# Patient Record
Sex: Female | Born: 1940
Health system: Southern US, Community
[De-identification: ages and names within clinical notes are randomized; demographics above are authoritative.]

## PROBLEM LIST (undated history)

## (undated) DIAGNOSIS — E7800 Pure hypercholesterolemia, unspecified: Secondary | ICD-10-CM

## (undated) DIAGNOSIS — M1990 Unspecified osteoarthritis, unspecified site: Secondary | ICD-10-CM

## (undated) DIAGNOSIS — E559 Vitamin D deficiency, unspecified: Secondary | ICD-10-CM

## (undated) DIAGNOSIS — I639 Cerebral infarction, unspecified: Secondary | ICD-10-CM

## (undated) DIAGNOSIS — C801 Malignant (primary) neoplasm, unspecified: Secondary | ICD-10-CM

## (undated) DIAGNOSIS — I712 Thoracic aortic aneurysm, without rupture: Secondary | ICD-10-CM

## (undated) DIAGNOSIS — E876 Hypokalemia: Secondary | ICD-10-CM

## (undated) DIAGNOSIS — H3530 Unspecified macular degeneration: Secondary | ICD-10-CM

## (undated) DIAGNOSIS — J449 Chronic obstructive pulmonary disease, unspecified: Secondary | ICD-10-CM

## (undated) DIAGNOSIS — I10 Essential (primary) hypertension: Secondary | ICD-10-CM

## (undated) DIAGNOSIS — H35039 Hypertensive retinopathy, unspecified eye: Secondary | ICD-10-CM

## (undated) HISTORY — DX: Unspecified macular degeneration: H35.30

## (undated) HISTORY — DX: Hypertensive retinopathy, unspecified eye: H35.039

## (undated) HISTORY — PX: ABDOMINAL HYSTERECTOMY: SHX81

## (undated) HISTORY — DX: Malignant (primary) neoplasm, unspecified: C80.1

## (undated) HISTORY — DX: Vitamin D deficiency, unspecified: E55.9

## (undated) HISTORY — PX: CHOLECYSTECTOMY: SHX55

## (undated) HISTORY — DX: Thoracic aortic aneurysm, without rupture: I71.2

---

## 1998-06-05 ENCOUNTER — Other Ambulatory Visit: Admission: RE | Admit: 1998-06-05 | Discharge: 1998-06-05 | Payer: Self-pay | Admitting: Obstetrics & Gynecology

## 1998-10-17 ENCOUNTER — Ambulatory Visit (HOSPITAL_COMMUNITY): Admission: RE | Admit: 1998-10-17 | Discharge: 1998-10-17 | Payer: Self-pay | Admitting: Family Medicine

## 1999-06-10 ENCOUNTER — Encounter: Admission: RE | Admit: 1999-06-10 | Discharge: 1999-06-10 | Payer: Self-pay | Admitting: Obstetrics & Gynecology

## 1999-06-10 ENCOUNTER — Encounter: Payer: Self-pay | Admitting: Obstetrics & Gynecology

## 1999-10-08 ENCOUNTER — Other Ambulatory Visit: Admission: RE | Admit: 1999-10-08 | Discharge: 1999-10-08 | Payer: Self-pay | Admitting: Obstetrics and Gynecology

## 2000-06-14 ENCOUNTER — Encounter: Admission: RE | Admit: 2000-06-14 | Discharge: 2000-06-14 | Payer: Self-pay | Admitting: Obstetrics and Gynecology

## 2000-06-14 ENCOUNTER — Encounter: Payer: Self-pay | Admitting: Obstetrics and Gynecology

## 2000-08-30 ENCOUNTER — Encounter: Payer: Self-pay | Admitting: Family Medicine

## 2000-08-30 ENCOUNTER — Encounter: Admission: RE | Admit: 2000-08-30 | Discharge: 2000-08-30 | Payer: Self-pay | Admitting: Family Medicine

## 2004-05-22 ENCOUNTER — Other Ambulatory Visit: Admission: RE | Admit: 2004-05-22 | Discharge: 2004-05-22 | Payer: Self-pay | Admitting: Family Medicine

## 2004-06-25 ENCOUNTER — Encounter: Admission: RE | Admit: 2004-06-25 | Discharge: 2004-06-25 | Payer: Self-pay | Admitting: Family Medicine

## 2007-11-16 ENCOUNTER — Ambulatory Visit (HOSPITAL_COMMUNITY): Admission: RE | Admit: 2007-11-16 | Discharge: 2007-11-16 | Payer: Self-pay | Admitting: Family Medicine

## 2007-11-17 ENCOUNTER — Encounter (INDEPENDENT_AMBULATORY_CARE_PROVIDER_SITE_OTHER): Payer: Self-pay | Admitting: Family Medicine

## 2007-11-17 ENCOUNTER — Ambulatory Visit: Payer: Self-pay

## 2013-05-09 ENCOUNTER — Emergency Department (HOSPITAL_COMMUNITY)
Admission: EM | Admit: 2013-05-09 | Discharge: 2013-05-09 | Discharge status: Against Medical Advice | Payer: Medicare HMO | Attending: Emergency Medicine | Admitting: Emergency Medicine

## 2013-05-09 ENCOUNTER — Encounter (HOSPITAL_COMMUNITY): Payer: Self-pay | Admitting: Emergency Medicine

## 2013-05-09 ENCOUNTER — Emergency Department (HOSPITAL_COMMUNITY): Payer: Medicare HMO

## 2013-05-09 DIAGNOSIS — R062 Wheezing: Secondary | ICD-10-CM | POA: Insufficient documentation

## 2013-05-09 DIAGNOSIS — E7800 Pure hypercholesterolemia, unspecified: Secondary | ICD-10-CM | POA: Insufficient documentation

## 2013-05-09 DIAGNOSIS — Y9389 Activity, other specified: Secondary | ICD-10-CM | POA: Insufficient documentation

## 2013-05-09 DIAGNOSIS — IMO0001 Reserved for inherently not codable concepts without codable children: Secondary | ICD-10-CM | POA: Insufficient documentation

## 2013-05-09 DIAGNOSIS — J449 Chronic obstructive pulmonary disease, unspecified: Secondary | ICD-10-CM | POA: Insufficient documentation

## 2013-05-09 DIAGNOSIS — F17200 Nicotine dependence, unspecified, uncomplicated: Secondary | ICD-10-CM | POA: Insufficient documentation

## 2013-05-09 DIAGNOSIS — S2231XA Fracture of one rib, right side, initial encounter for closed fracture: Secondary | ICD-10-CM

## 2013-05-09 DIAGNOSIS — S2239XA Fracture of one rib, unspecified side, initial encounter for closed fracture: Secondary | ICD-10-CM | POA: Insufficient documentation

## 2013-05-09 DIAGNOSIS — I10 Essential (primary) hypertension: Secondary | ICD-10-CM | POA: Insufficient documentation

## 2013-05-09 DIAGNOSIS — IMO0002 Reserved for concepts with insufficient information to code with codable children: Secondary | ICD-10-CM | POA: Insufficient documentation

## 2013-05-09 DIAGNOSIS — Y929 Unspecified place or not applicable: Secondary | ICD-10-CM | POA: Insufficient documentation

## 2013-05-09 DIAGNOSIS — J4489 Other specified chronic obstructive pulmonary disease: Secondary | ICD-10-CM | POA: Insufficient documentation

## 2013-05-09 HISTORY — DX: Pure hypercholesterolemia, unspecified: E78.00

## 2013-05-09 HISTORY — DX: Essential (primary) hypertension: I10

## 2013-05-09 HISTORY — DX: Chronic obstructive pulmonary disease, unspecified: J44.9

## 2013-05-09 HISTORY — DX: Hypokalemia: E87.6

## 2013-05-09 MED ORDER — OXYCODONE-ACETAMINOPHEN 5-325 MG PO TABS: 1.0000 | ORAL_TABLET | ORAL | Status: DC | PRN

## 2013-05-09 MED ORDER — METHOCARBAMOL 500 MG PO TABS: 500.0000 mg | ORAL_TABLET | Freq: Two times a day (BID) | ORAL | Status: DC | PRN

## 2013-05-09 NOTE — ED Notes (Signed)
Resp therapist called to inquire about new order.

## 2013-05-09 NOTE — ED Notes (Signed)
Pt states she needs to leave to go to work  

## 2013-05-09 NOTE — ED Notes (Addendum)
Pt completed incentive spirometry, PA informed. Max level reached: 1250

## 2013-05-09 NOTE — ED Notes (Signed)
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  Small bruise noted. C/o right flank/rib pain worse with coughing. Denies SOB. No deformity or crepitus. COPD pt with wheezes auscultated, per pt that is normal for her.

## 2013-05-09 NOTE — ED Notes (Addendum)
Pt unwilling to stay for further evaluation, pt states she has to leave. PA informed. Pt states I am breathing just fine.

## 2013-05-09 NOTE — ED Notes (Signed)
After reviewing all risks of leaving, pt states she will stay for further evaluation. PA updated.

## 2013-05-09 NOTE — ED Notes (Signed)
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 Pt states she did not lose consciousness. Pt states she has pain when she twists at the torso and bends over. Pt states if she does not move she is not in pain.

## 2013-05-09 NOTE — ED Provider Notes (Signed)
CSN: [id number]     Arrival date & time 05/09/13  1925 History  This chart was scribed for non-physician practitioner Dierdre Forth, PA-C working with Derwood Kaplan, MD by Clydene Laming, ED Scribe. This patient was seen in room TR07C/TR07C and the patient's care was started at 9:50 PM.   Chief Complaint  Patient presents with  . Fall    The history is provided by the patient and medical records. No language interpreter was used.   HPI Comments: Chelsey Young is a 72 y.o. 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 Pt states she fell onto the banister on her right rib cage. She reports that she did not fall to the ground, hit her head no loss of consciousness. She denies neck or back pain. She reports taking ibuorofen with mild relief. She states that breathing deeply increases the pain.States she feels swollen in the area of the injury. She denies sob, fever, or chills.  Patient with history of COPD, hypertension and states that she normally wheezes at baseline. She denies frank shortness of breath or anterior chest pain.  She reports she takes Spiriva intermittently for her COPD.  She reports that her major concern today is the pain in her ribs.  Past Medical History  Diagnosis Date  . COPD (chronic obstructive pulmonary disease)   . Hypercholesteremia   . Hypertension   . Hypokalemia    History reviewed. No pertinent past surgical history. History reviewed. No pertinent family history. History  Substance Use Topics  . Smoking status: Current Every Day Smoker    Types: Cigarettes  . Smokeless tobacco: Not on file  . Alcohol Use: No   OB History   Grav Para Term Preterm Abortions TAB SAB Ect Mult Living                 Review of Systems  Constitutional: Negative for fever and chills.  Respiratory: Positive for wheezing. Negative for shortness of breath.   Gastrointestinal: Negative  for nausea and vomiting.  Musculoskeletal: Positive for arthralgias and myalgias. Negative for back pain, joint swelling, neck pain and neck stiffness.       Rib pain  Skin: Negative for wound.  Neurological: Negative for numbness.  Hematological: Does not bruise/bleed easily.  Psychiatric/Behavioral: The patient is not nervous/anxious.   All other systems reviewed and are negative.    Allergies  Review of patient's allergies indicates no known allergies.  Home Medications   Current Outpatient Rx  Name  Route  Sig  Dispense  Refill  . ibuprofen (ADVIL,MOTRIN) 200 MG tablet   Oral   Take 600 mg by mouth every 4 (four) hours as needed for pain.         . methocarbamol (ROBAXIN) 500 MG tablet   Oral   Take 1 tablet (500 mg total) by mouth 2 (two) times daily as needed.   20 tablet   0   . oxyCODONE-acetaminophen (PERCOCET/ROXICET) 5-325 MG per tablet   Oral   Take 1 tablet by mouth every 4 (four) hours as needed for pain.   15 tablet   0    Triage Vials: BP 134/103  Pulse 73  Temp(Src) 98.4 F (36.9 C) (Oral)  Resp 22  Wt 130 lb (58.968 kg)  SpO2 94% Physical Exam  Nursing note and vitals reviewed. Constitutional: She appears well-developed and well-nourished. No distress.  Awake, alert, nontoxic appearance  HENT:  Head:  Normocephalic and atraumatic.  Mouth/Throat: Oropharynx is clear and moist. No oropharyngeal exudate.  Eyes: Conjunctivae are normal. No scleral icterus.  Neck: Normal range of motion. Neck supple.  Cardiovascular: Normal rate, regular rhythm, normal heart sounds and intact distal pulses.   No murmur heard. Pulmonary/Chest: Effort normal. No accessory muscle usage. Not tachypneic. No respiratory distress. She has no decreased breath sounds. She has wheezes (mild, throughout). She has no rhonchi. She has no rales.    Tender palpation of the right lower lateral chest wall with mild ecchymosis over rib #10 No crepitus or flail segment Equal  breath sounds with mild wheezing throughout; no rales, rhonchi or absent breath sounds.  Abdominal: Soft. Bowel sounds are normal. She exhibits no mass. There is no tenderness. There is no rebound and no guarding.  Musculoskeletal: Normal range of motion. She exhibits no edema.  Neurological: She is alert. She exhibits normal muscle tone. Coordination normal.  Speech is clear and goal oriented Moves extremities without ataxia  Skin: Skin is warm and dry. She is not diaphoretic. No erythema.  Psychiatric: She has a normal mood and affect. Her behavior is normal.    ED Course  Procedures (including critical care time) DIAGNOSTIC STUDIES: Oxygen Saturation is 94% on RA, adequate by my interpretation.    COORDINATION OF CARE: 10:01 PM- Discussed treatment plan with pt at bedside. Pt verbalized understanding and agreement with plan.   Labs Review Labs Reviewed - No data to display Imaging Review Dg Ribs Unilateral W/chest Right  05/09/2013   CLINICAL DATA:  Fall with right rib pain.  EXAM: RIGHT RIBS AND CHEST - 3+ VIEW  COMPARISON:  None.  FINDINGS: Chest radiograph demonstrates clear lungs. Heart and mediastinum are within normal limits. Negative for a pneumothorax. There appears to be a mildly displaced fracture of the lateral right 10th rib.  IMPRESSION: Mildly displaced fracture of the right 10th rib. Negative for pneumothorax.   Electronically Signed   By: Richarda Overlie M.D.   On: 05/09/2013 20:29    EKG Interpretation   None       MDM   1. Rib fracture, right, closed, initial encounter      Chelsey Young presents with rib pain after minor fall into a banister yesterday.   Chest x-ray with mildly displaced fracture of the right 10th rib.  Patient alert and oriented, nontoxic, nonseptic appearing. No accessory muscle use or tachypnea. Patient without hypoxia here. No flail segment or crepitus. Patient denies shortness of breath but does endorse increased pain with deep  breathing. Patient unwilling to consider admission for pain control reporting that she would like to go home.  Patient very mobile and not febrile. No pneumothorax on x-ray and no major displacement of the rib. Will DC home with pain control and muscle relaxer. Recommend close followup primary care physician late this week for reevaluation.  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 Strict return percussions and signs and symptoms of pneumonia discussed.  It has been determined that no acute conditions requiring further emergency intervention are present at this time. The patient/guardian have been advised of the diagnosis and plan. We have discussed signs and symptoms that warrant return to the ED, such as changes or worsening in symptoms.   Vital signs are stable at discharge.   BP 162/79  Pulse 65  Temp(Src) 98.4 F (36.9 C) (Oral)  Resp 22  Wt 130 lb (58.968 kg)  SpO2 97%  Patient/guardian has voiced understanding  and agreed to follow-up with the PCP or specialist.   Patient was discussed with Dr. Derwood Kaplan who agrees with the plan to discharge.   Dahlia Client , PA-C 05/09/13 2309

## 2013-05-09 NOTE — ED Notes (Signed)
Pt states she wants to leave AMA 

## 2013-05-09 NOTE — ED Notes (Signed)
Informed it was okay to discharge the pt, pt has been cleared for discharge.

## 2013-05-10 NOTE — ED Provider Notes (Signed)
Medical screening examination/treatment/procedure(s) were performed by non-physician practitioner and as supervising physician I was immediately available for consultation/collaboration.   Derwood Kaplan, MD 05/10/13 [phone]

## 2013-06-25 ENCOUNTER — Encounter (HOSPITAL_COMMUNITY): Payer: Self-pay | Admitting: Emergency Medicine

## 2013-06-25 ENCOUNTER — Emergency Department (HOSPITAL_COMMUNITY): Payer: Medicare HMO

## 2013-06-25 ENCOUNTER — Emergency Department (HOSPITAL_COMMUNITY)
Admission: EM | Admit: 2013-06-25 | Discharge: 2013-06-25 | Disposition: A | Discharge status: Home / Self Care | Payer: Medicare HMO | Attending: Emergency Medicine | Admitting: Emergency Medicine

## 2013-06-25 DIAGNOSIS — Y9389 Activity, other specified: Secondary | ICD-10-CM | POA: Insufficient documentation

## 2013-06-25 DIAGNOSIS — Z7982 Long term (current) use of aspirin: Secondary | ICD-10-CM | POA: Insufficient documentation

## 2013-06-25 DIAGNOSIS — I10 Essential (primary) hypertension: Secondary | ICD-10-CM | POA: Insufficient documentation

## 2013-06-25 DIAGNOSIS — J449 Chronic obstructive pulmonary disease, unspecified: Secondary | ICD-10-CM | POA: Insufficient documentation

## 2013-06-25 DIAGNOSIS — F17200 Nicotine dependence, unspecified, uncomplicated: Secondary | ICD-10-CM | POA: Insufficient documentation

## 2013-06-25 DIAGNOSIS — X500XXA Overexertion from strenuous movement or load, initial encounter: Secondary | ICD-10-CM | POA: Insufficient documentation

## 2013-06-25 DIAGNOSIS — E7800 Pure hypercholesterolemia, unspecified: Secondary | ICD-10-CM | POA: Insufficient documentation

## 2013-06-25 DIAGNOSIS — Y929 Unspecified place or not applicable: Secondary | ICD-10-CM | POA: Insufficient documentation

## 2013-06-25 DIAGNOSIS — Z79899 Other long term (current) drug therapy: Secondary | ICD-10-CM | POA: Insufficient documentation

## 2013-06-25 DIAGNOSIS — J4489 Other specified chronic obstructive pulmonary disease: Secondary | ICD-10-CM | POA: Insufficient documentation

## 2013-06-25 DIAGNOSIS — S63509A Unspecified sprain of unspecified wrist, initial encounter: Secondary | ICD-10-CM | POA: Insufficient documentation

## 2013-06-25 MED ORDER — HYDROCODONE-ACETAMINOPHEN 5-325 MG PO TABS: 1.0000 | ORAL_TABLET | ORAL | Status: DC | PRN

## 2013-06-25 NOTE — ED Notes (Signed)
Pt was helping son move an amplifier and she felt a pop in her L wrist/forearm and shes had pain there since. Cms intact

## 2013-06-25 NOTE — ED Provider Notes (Signed)
Patient injured her left forearm all time to lift something heavy. On exam she has tenderness in the mid forearm, the compartments are soft, her wrist and elbow are very supple, normal pulses at the left wrist, normal sensation and grip. X-ray reveals no signs of fracture. Patient will be stable for discharge with Rice therapy and sling.  Medical screening examination/treatment/procedure(s) were conducted as a shared visit with non-physician practitioner(s) and myself.  I personally evaluated the patient during the encounter.        Vida Roller, MD 06/27/13 [phone]

## 2013-06-25 NOTE — ED Provider Notes (Signed)
CSN: [id number]     Arrival date & time 06/25/13  1733 History  This chart was scribed for Marlon Pel, PA-C, working with Vida Roller, MD by Blanchard Kelch, ED Scribe. This patient was seen in room TR06C/TR06C and the patient's care was started at 6:18 PM.      Chief Complaint  Patient presents with  . Arm Pain    Patient is a 72 y.o. female presenting with arm pain. The history is provided by the patient. No language interpreter was used.  Arm Pain    HPI Comments: Chelsey Young is a 72 y.o. female who presents to the Emergency Department complaining of a left wrist injury that occurred when she was helping to lift a 200 lb amplifier two hours ago. She states that she heard a pop and had pain to her left forearm and wrist immediately after that occurred. She has associated swelling to the forearm. The pain is still present but mildly improved from the onset. The pain is worsened with movement, but she denies decreased range of motion. She states that she has fractured her left elbow in the past. She denies any other injury.   Past Medical History  Diagnosis Date  . COPD (chronic obstructive pulmonary disease)   . Hypercholesteremia   . Hypertension   . Hypokalemia    History reviewed. No pertinent past surgical history. History reviewed. No pertinent family history. History  Substance Use Topics  . Smoking status: Current Every Day Smoker    Types: Cigarettes  . Smokeless tobacco: Not on file  . Alcohol Use: No   OB History   Grav Para Term Preterm Abortions TAB SAB Ect Mult Living                 Review of Systems  Musculoskeletal: Positive for arthralgias and joint swelling.  All other systems reviewed and are negative.    Allergies  Review of patient's allergies indicates no known allergies.  Home Medications   Current Outpatient Rx  Name  Route  Sig  Dispense  Refill  . aspirin 81 MG tablet   Oral   Take 81 mg by mouth daily.         .  potassium chloride SA (K-DUR,KLOR-CON) 20 MEQ tablet   Oral   Take 20 mEq by mouth daily.         Marland Kitchen HYDROcodone-acetaminophen (NORCO/VICODIN) 5-325 MG per tablet   Oral   Take 1 tablet by mouth every 4 (four) hours as needed.   10 tablet   0    Triage Vitals: BP 157/69  Pulse 78  Temp(Src) 98.1 F (36.7 C) (Oral)  Resp 22  Wt 125 lb (56.7 kg)  SpO2 95%  Physical Exam  Nursing note and vitals reviewed. Constitutional: She appears well-developed and well-nourished. No distress.  HENT:  Head: Normocephalic and atraumatic.  Eyes: Pupils are equal, round, and reactive to light.  Neck: Normal range of motion. Neck supple.  Cardiovascular: Normal rate and regular rhythm.   Pulmonary/Chest: Effort normal.  Abdominal: Soft.  Musculoskeletal:       Left wrist: She exhibits tenderness and bony tenderness. She exhibits normal range of motion, no swelling, no effusion, no crepitus, no deformity and no laceration.  Neurological: She is alert.  Skin: Skin is warm and dry.    ED Course  Procedures (including critical care time)  DIAGNOSTIC STUDIES: Oxygen Saturation is 95% on room air, adequate by my interpretation.    COORDINATION OF  CARE: 6:20 PM -Will order left forearm x-ray. Patient verbalizes understanding and agrees with treatment plan.  7:49 PM- Patient has wrist sprain. Discussed results of x-ray with patient, which did not show any fractures. Dr. Hyacinth Meeker saw patient and recommends ice, rest and pain medication. Patient verbalizes understanding and agrees with treatment plan.  Labs Review Labs Reviewed - No data to display Imaging Review Dg Forearm Left  06/25/2013   CLINICAL DATA:  Forearm pain after injury.  EXAM: LEFT FOREARM - 2 VIEW  COMPARISON:  None.  FINDINGS: Two view exam of the left forearm shows no evidence for an acute fracture. The patient is status post resection of the radial head. There is degenerative change at the elbow. No worrisome lytic or sclerotic  osseous abnormality.  IMPRESSION: No acute bony findings.   Electronically Signed   By: Kennith Center M.D.   On: 06/25/2013 19:34    EKG Interpretation   None       MDM   1. Wrist sprain and strain, left, initial encounter    72 y.o.Chelsey Young's evaluation in the Emergency Department is complete. It has been determined that no acute conditions requiring further emergency intervention are present at this time. The patient/guardian have been advised of the diagnosis and plan. We have discussed signs and symptoms that warrant return to the ED, such as changes or worsening in symptoms.  Vital signs are stable at discharge. Filed Vitals:   06/25/13 1933  BP: 151/73  Pulse: 63  Temp:   Resp: 18    Patient/guardian has voiced understanding and agreed to follow-up with the PCP or specialist.  I personally performed the services described in this documentation, which was scribed in my presence. The recorded information has been reviewed and is accurate.   Dorthula Matas, PA-C 06/25/13 1954

## 2013-06-27 NOTE — ED Provider Notes (Signed)
Patient injured her left forearm all time to lift something heavy. On exam she has tenderness in the mid forearm, the compartments are soft, her wrist and elbow are very supple, normal pulses at the left wrist, normal sensation and grip. X-ray reveals no signs of fracture. Patient will be stable for discharge with Rice therapy and sling.  Medical screening examination/treatment/procedure(s) were conducted as a shared visit with non-physician practitioner(s) and myself.  I personally evaluated the patient during the encounter.         D , MD 06/27/13 0054 

## 2013-07-01 ENCOUNTER — Emergency Department (HOSPITAL_COMMUNITY)
Admission: EM | Admit: 2013-07-01 | Discharge: 2013-07-01 | Disposition: A | Discharge status: Home / Self Care | Payer: Medicare HMO | Attending: Emergency Medicine | Admitting: Emergency Medicine

## 2013-07-01 ENCOUNTER — Emergency Department (HOSPITAL_COMMUNITY): Payer: Medicare HMO

## 2013-07-01 ENCOUNTER — Encounter (HOSPITAL_COMMUNITY): Payer: Self-pay | Admitting: Emergency Medicine

## 2013-07-01 DIAGNOSIS — S0990XA Unspecified injury of head, initial encounter: Secondary | ICD-10-CM

## 2013-07-01 DIAGNOSIS — I10 Essential (primary) hypertension: Secondary | ICD-10-CM | POA: Insufficient documentation

## 2013-07-01 DIAGNOSIS — Z7982 Long term (current) use of aspirin: Secondary | ICD-10-CM | POA: Insufficient documentation

## 2013-07-01 DIAGNOSIS — Y929 Unspecified place or not applicable: Secondary | ICD-10-CM | POA: Insufficient documentation

## 2013-07-01 DIAGNOSIS — S0101XA Laceration without foreign body of scalp, initial encounter: Secondary | ICD-10-CM

## 2013-07-01 DIAGNOSIS — W1809XA Striking against other object with subsequent fall, initial encounter: Secondary | ICD-10-CM | POA: Insufficient documentation

## 2013-07-01 DIAGNOSIS — Z8639 Personal history of other endocrine, nutritional and metabolic disease: Secondary | ICD-10-CM | POA: Insufficient documentation

## 2013-07-01 DIAGNOSIS — F17200 Nicotine dependence, unspecified, uncomplicated: Secondary | ICD-10-CM | POA: Insufficient documentation

## 2013-07-01 DIAGNOSIS — Y93E1 Activity, personal bathing and showering: Secondary | ICD-10-CM | POA: Insufficient documentation

## 2013-07-01 DIAGNOSIS — J4489 Other specified chronic obstructive pulmonary disease: Secondary | ICD-10-CM | POA: Insufficient documentation

## 2013-07-01 DIAGNOSIS — Z79899 Other long term (current) drug therapy: Secondary | ICD-10-CM | POA: Insufficient documentation

## 2013-07-01 DIAGNOSIS — Z862 Personal history of diseases of the blood and blood-forming organs and certain disorders involving the immune mechanism: Secondary | ICD-10-CM | POA: Insufficient documentation

## 2013-07-01 DIAGNOSIS — J449 Chronic obstructive pulmonary disease, unspecified: Secondary | ICD-10-CM | POA: Insufficient documentation

## 2013-07-01 DIAGNOSIS — S0100XA Unspecified open wound of scalp, initial encounter: Secondary | ICD-10-CM | POA: Insufficient documentation

## 2013-07-01 DIAGNOSIS — Z8673 Personal history of transient ischemic attack (TIA), and cerebral infarction without residual deficits: Secondary | ICD-10-CM | POA: Insufficient documentation

## 2013-07-01 HISTORY — DX: Cerebral infarction, unspecified: I63.9

## 2013-07-01 MED ORDER — LIDOCAINE-EPINEPHRINE-TETRACAINE (LET) SOLUTION
3.0000 mL | Freq: Once | NASAL | Status: DC
  Filled 2013-07-01: qty 3

## 2013-07-01 NOTE — ED Provider Notes (Signed)
CSN: [id number]     Arrival date & time 07/01/13  2041 History  This chart was scribed for non-physician practitioner, Renne Crigler, PA-C working with Flint Melter, MD, by Andrew Au, ED Scribe. This patient was seen in room TR09C/TR09C and the patient's care was started at     Chief Complaint  Patient presents with  . Fall  . Head Injury   The history is provided by the patient. No language interpreter was used.   HPI Comments: Chelsey Young is a 72 y.o. female who presents to the Emergency Department complaining of head injury onset immediately prior to arrival. She states while coming out of the bathtub she missed the top step which caused her to slip, fall and hit the back of her head on the sink.cabinet.  She sustained laceration to occiput. After the fall she states that she felt her head and noticed  blood after the fall. She denies LOC, blurred vision, emesis, numbness weakness, tingling of the extremities. She also struck her L forehead during the fall.  She was brought to the ED by neighbor. She takes ASA but no other anticoagulants. She reports that her tetanus is UTD.  Past Medical History  Diagnosis Date  . COPD (chronic obstructive pulmonary disease)   . Hypercholesteremia   . Hypertension   . Hypokalemia   . Stroke    Past Surgical History  Procedure Laterality Date  . Abdominal hysterectomy     History reviewed. No pertinent family history. History  Substance Use Topics  . Smoking status: Current Some Day Smoker    Types: Cigarettes  . Smokeless tobacco: Not on file  . Alcohol Use: No   OB History   Grav Para Term Preterm Abortions TAB SAB Ect Mult Living                 Review of Systems  Constitutional: Negative for fatigue.  HENT: Negative for tinnitus.   Eyes: Negative for photophobia, pain and visual disturbance.  Respiratory: Negative for shortness of breath.   Cardiovascular: Negative for chest pain.  Gastrointestinal: Negative for nausea  and vomiting.  Musculoskeletal: Negative for back pain, gait problem and neck pain.  Skin: Positive for wound.  Neurological: Negative for dizziness, syncope, weakness, light-headedness, numbness and headaches.  Psychiatric/Behavioral: Negative for confusion and decreased concentration.    Allergies  Review of patient's allergies indicates no known allergies.  Home Medications   Current Outpatient Rx  Name  Route  Sig  Dispense  Refill  . aspirin 81 MG tablet   Oral   Take 81 mg by mouth daily.         Marland Kitchen ibuprofen (ADVIL,MOTRIN) 200 MG tablet   Oral   Take 200 mg by mouth every 6 (six) hours as needed for headache.         . potassium chloride SA (K-DUR,KLOR-CON) 20 MEQ tablet   Oral   Take 20 mEq by mouth daily.          Triage Vitals BP 137/57  Pulse 82  Temp(Src) 97.4 F (36.3 C) (Oral)  Resp 18  Ht 5\' 2"  (1.575 m)  Wt 130 lb 3.2 oz (59.058 kg)  BMI 23.81 kg/m2  SpO2 98% Physical Exam  Nursing note and vitals reviewed. Constitutional: She is oriented to person, place, and time. She appears well-developed and well-nourished. No distress.  HENT:  Head: Normocephalic and atraumatic. Head is without raccoon's eyes and without Battle's sign.  Right Ear: Tympanic membrane, external  ear and ear canal normal. No hemotympanum.  Left Ear: Tympanic membrane, external ear and ear canal normal. No hemotympanum.  Nose: Nose normal. No nasal septal hematoma.  Mouth/Throat: Uvula is midline, oropharynx is clear and moist and mucous membranes are normal.  4 cm linear clean hemostatic laceration to the occipital scalp. Minor L frontal hematoma.   Eyes: Conjunctivae, EOM and lids are normal. Pupils are equal, round, and reactive to light. Right eye exhibits no nystagmus. Left eye exhibits no nystagmus.  No visible hyphema noted  Neck: Normal range of motion. Neck supple. No tracheal deviation present.  Cardiovascular: Normal rate and regular rhythm.   Pulmonary/Chest: Effort  normal and breath sounds normal. No respiratory distress.  Abdominal: Soft. There is no tenderness.  Musculoskeletal: Normal range of motion.       Cervical back: She exhibits normal range of motion, no tenderness and no bony tenderness.       Thoracic back: She exhibits no tenderness and no bony tenderness.       Lumbar back: She exhibits no tenderness and no bony tenderness.  Neurological: She is alert and oriented to person, place, and time. She has normal strength and normal reflexes. No cranial nerve deficit or sensory deficit. Coordination normal. GCS eye subscore is 4. GCS verbal subscore is 5. GCS motor subscore is 6.  Skin: Skin is warm and dry.  Psychiatric: She has a normal mood and affect. Her behavior is normal.    ED Course  Procedures  DIAGNOSTIC STUDIES: Oxygen Saturation is 98% on RA, normal by my interpretation.    COORDINATION OF CARE: 9:44 PM- Pt advised of plan for treatment and pt agrees.  LACERATION REPAIR PROCEDURE NOTE The patient's identification was confirmed and consent was obtained. This procedure was performed by Renne Crigler, PA-C at 9:44 PM. Site: occipital scalp  Sterile procedures observe Anesthetic used: 2% lidocaine with epi, 5 cc Length: 4 cm # of Staples: 5 Tetanus UTD Site anesthetized, irrigated with dermal cleanser and skin scrub, explored without evidence of foreign body, wound well approximated, site covered with dry, sterile dressing.  Patient tolerated procedure well without complications. Instructions for care discussed verbally and patient provided with additional written instructions for homecare and f/u.  CT reviewed by myself. Spoke with Dr. Benard Rink who noted L frontal hematoma and patient confirmed to me that she did hit the area.   Patient was counseled on head injury precautions and symptoms that should indicate their return to the ED.  These include severe worsening headache, vision changes, confusion, loss of consciousness,  trouble walking, nausea & vomiting, or weakness/tingling in extremities.    Patient counseled on wound care. Patient counseled on need to return or see PCP/urgent care for suture removal in 5-7 days. Patient was urged to return to the Emergency Department urgently with worsening pain, swelling, expanding erythema especially if it streaks away from the affected area, fever, or if they have any other concerns. Patient verbalized understanding.    Medications  lidocaine-EPINEPHrine-tetracaine (LET) solution (3 mLs Topical Pending 07/01/13 2158)   Labs Review Labs Reviewed - No data to display Imaging Review Ct Head Wo Contrast  07/01/2013   CLINICAL DATA:  Larey Seat out of tub.  Hit head.  Pain.  EXAM: CT HEAD WITHOUT CONTRAST  TECHNIQUE: Contiguous axial images were obtained from the base of the skull through the vertex without contrast.  COMPARISON:  MR brain 11/16/2007.  FINDINGS: No evidence for acute infarction, hemorrhage, mass lesion, hydrocephalus, or extra-axial fluid.  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  Moderate size left frontal scalp hematoma for soft tissue swelling. No underlying skull fracture. There is a history of hitting the back of head on the sink with slight right paramedian soft tissue swelling but no underlying occipital bone fracture. No underlying subdural hematoma. No contrecoup injury. Vascular calcification. Clear sinuses and mastoids.  IMPRESSION: Left frontal scalp hematoma? Without skull fracture or intracranial hemorrhage. Right paramedian occipital scalp laceration fairly subtle but no occipital bone fracture. No evidence for subdural collection. Chronic changes as described.   Electronically Signed   By: Davonna Belling M.D.   On: 07/01/2013 22:44    EKG Interpretation   None       MDM   1. Scalp laceration, initial encounter   2. Head injury, initial encounter    Head injury, CT neg for intracranial injury.    Laceration -- repaired without complication.   I personally performed the services described in this documentation, which was scribed in my presence. The recorded information has been reviewed and is accurate.    Renne Crigler, PA-C 07/01/13 2336

## 2013-07-01 NOTE — ED Notes (Signed)
Pt st's she was getting out of bathtub and fell hitting her head on the counter. Pt has laceration to top of head.  Bleeding controlled.  Pt denies LOC.  Pt alert and oriented x's 3.

## 2013-07-01 NOTE — ED Notes (Signed)
Patient stated she fell out of the bathtub.  States she has a garden tub and missed the top step when coming out.

## 2013-07-01 NOTE — ED Notes (Signed)
Discharge and follow up instructions reviewed. Pt verbalized understanding.  

## 2013-07-02 NOTE — ED Provider Notes (Signed)
Medical screening examination/treatment/procedure(s) were performed by non-physician practitioner and as supervising physician I was immediately available for consultation/collaboration.   L , MD 07/02/13 0103 

## 2014-07-25 DIAGNOSIS — N289 Disorder of kidney and ureter, unspecified: Secondary | ICD-10-CM | POA: Diagnosis not present

## 2014-08-03 DIAGNOSIS — J441 Chronic obstructive pulmonary disease with (acute) exacerbation: Secondary | ICD-10-CM | POA: Diagnosis not present

## 2014-11-06 DIAGNOSIS — Z8541 Personal history of malignant neoplasm of cervix uteri: Secondary | ICD-10-CM | POA: Diagnosis not present

## 2014-11-06 DIAGNOSIS — Z7982 Long term (current) use of aspirin: Secondary | ICD-10-CM | POA: Diagnosis not present

## 2014-11-06 DIAGNOSIS — S51812A Laceration without foreign body of left forearm, initial encounter: Secondary | ICD-10-CM | POA: Diagnosis not present

## 2014-11-06 DIAGNOSIS — E780 Pure hypercholesterolemia: Secondary | ICD-10-CM | POA: Diagnosis not present

## 2014-11-06 DIAGNOSIS — Z8673 Personal history of transient ischemic attack (TIA), and cerebral infarction without residual deficits: Secondary | ICD-10-CM | POA: Diagnosis not present

## 2014-11-06 DIAGNOSIS — J449 Chronic obstructive pulmonary disease, unspecified: Secondary | ICD-10-CM | POA: Diagnosis not present

## 2014-11-06 DIAGNOSIS — I10 Essential (primary) hypertension: Secondary | ICD-10-CM | POA: Diagnosis not present

## 2014-11-06 DIAGNOSIS — Z0000 Encounter for general adult medical examination without abnormal findings: Secondary | ICD-10-CM | POA: Diagnosis not present

## 2014-11-21 DIAGNOSIS — Z1212 Encounter for screening for malignant neoplasm of rectum: Secondary | ICD-10-CM | POA: Diagnosis not present

## 2014-12-06 DIAGNOSIS — F17200 Nicotine dependence, unspecified, uncomplicated: Secondary | ICD-10-CM | POA: Diagnosis not present

## 2014-12-06 DIAGNOSIS — I10 Essential (primary) hypertension: Secondary | ICD-10-CM | POA: Diagnosis not present

## 2015-01-16 DIAGNOSIS — H18413 Arcus senilis, bilateral: Secondary | ICD-10-CM | POA: Diagnosis not present

## 2015-01-16 DIAGNOSIS — H524 Presbyopia: Secondary | ICD-10-CM | POA: Diagnosis not present

## 2015-01-16 DIAGNOSIS — H3532 Exudative age-related macular degeneration: Secondary | ICD-10-CM | POA: Diagnosis not present

## 2015-01-16 DIAGNOSIS — Z9849 Cataract extraction status, unspecified eye: Secondary | ICD-10-CM | POA: Diagnosis not present

## 2015-01-16 DIAGNOSIS — H52223 Regular astigmatism, bilateral: Secondary | ICD-10-CM | POA: Diagnosis not present

## 2015-01-16 DIAGNOSIS — H2513 Age-related nuclear cataract, bilateral: Secondary | ICD-10-CM | POA: Diagnosis not present

## 2015-01-16 DIAGNOSIS — H5203 Hypermetropia, bilateral: Secondary | ICD-10-CM | POA: Diagnosis not present

## 2015-01-16 DIAGNOSIS — H11153 Pinguecula, bilateral: Secondary | ICD-10-CM | POA: Diagnosis not present

## 2015-02-05 DIAGNOSIS — J441 Chronic obstructive pulmonary disease with (acute) exacerbation: Secondary | ICD-10-CM | POA: Diagnosis not present

## 2015-02-06 ENCOUNTER — Encounter (INDEPENDENT_AMBULATORY_CARE_PROVIDER_SITE_OTHER): Payer: Self-pay | Admitting: Ophthalmology

## 2015-03-01 ENCOUNTER — Encounter (INDEPENDENT_AMBULATORY_CARE_PROVIDER_SITE_OTHER): Payer: Commercial Managed Care - HMO | Admitting: Ophthalmology

## 2015-03-15 ENCOUNTER — Encounter (INDEPENDENT_AMBULATORY_CARE_PROVIDER_SITE_OTHER): Payer: Self-pay | Admitting: Ophthalmology

## 2015-04-08 ENCOUNTER — Encounter (INDEPENDENT_AMBULATORY_CARE_PROVIDER_SITE_OTHER): Payer: Commercial Managed Care - HMO | Admitting: Ophthalmology

## 2015-05-11 ENCOUNTER — Encounter (HOSPITAL_COMMUNITY): Payer: Self-pay

## 2015-05-11 ENCOUNTER — Emergency Department (HOSPITAL_COMMUNITY)
Admission: EM | Admit: 2015-05-11 | Discharge: 2015-05-11 | Disposition: A | Discharge status: Home / Self Care | Payer: Commercial Managed Care - HMO | Attending: Emergency Medicine | Admitting: Emergency Medicine

## 2015-05-11 DIAGNOSIS — W2209XA Striking against other stationary object, initial encounter: Secondary | ICD-10-CM | POA: Diagnosis not present

## 2015-05-11 DIAGNOSIS — S0101XA Laceration without foreign body of scalp, initial encounter: Secondary | ICD-10-CM | POA: Insufficient documentation

## 2015-05-11 DIAGNOSIS — S0990XA Unspecified injury of head, initial encounter: Secondary | ICD-10-CM | POA: Diagnosis present

## 2015-05-11 DIAGNOSIS — R51 Headache: Secondary | ICD-10-CM | POA: Diagnosis not present

## 2015-05-11 DIAGNOSIS — Y999 Unspecified external cause status: Secondary | ICD-10-CM | POA: Diagnosis not present

## 2015-05-11 DIAGNOSIS — Z720 Tobacco use: Secondary | ICD-10-CM | POA: Insufficient documentation

## 2015-05-11 DIAGNOSIS — Y92410 Unspecified street and highway as the place of occurrence of the external cause: Secondary | ICD-10-CM | POA: Diagnosis not present

## 2015-05-11 DIAGNOSIS — Y9389 Activity, other specified: Secondary | ICD-10-CM | POA: Diagnosis not present

## 2015-05-11 DIAGNOSIS — S0191XA Laceration without foreign body of unspecified part of head, initial encounter: Secondary | ICD-10-CM

## 2015-05-11 DIAGNOSIS — Z7982 Long term (current) use of aspirin: Secondary | ICD-10-CM | POA: Diagnosis not present

## 2015-05-11 DIAGNOSIS — J449 Chronic obstructive pulmonary disease, unspecified: Secondary | ICD-10-CM | POA: Insufficient documentation

## 2015-05-11 DIAGNOSIS — Z8673 Personal history of transient ischemic attack (TIA), and cerebral infarction without residual deficits: Secondary | ICD-10-CM | POA: Diagnosis not present

## 2015-05-11 DIAGNOSIS — Z79899 Other long term (current) drug therapy: Secondary | ICD-10-CM | POA: Diagnosis not present

## 2015-05-11 DIAGNOSIS — I10 Essential (primary) hypertension: Secondary | ICD-10-CM | POA: Diagnosis not present

## 2015-05-11 DIAGNOSIS — Z8639 Personal history of other endocrine, nutritional and metabolic disease: Secondary | ICD-10-CM | POA: Diagnosis not present

## 2015-05-11 MED ORDER — LIDOCAINE-EPINEPHRINE-TETRACAINE (LET) SOLUTION
3.0000 mL | Freq: Once | NASAL | Status: AC
  Administered 2015-05-11: 3 mL via TOPICAL
  Filled 2015-05-11: qty 3

## 2015-05-11 NOTE — ED Notes (Addendum)
Pt states the trunk of her hatchback/car fell down and hit the top back part of her head today. No LOC, no fall. There is about a 1 inch vertical laceration to back of head, scant bleeding but controlled. Pt denies being on blood thinners.

## 2015-05-11 NOTE — ED Notes (Signed)
Pt A&OX4, ambulatory at d/c with steady gait, NAD 
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
## 2015-05-11 NOTE — Discharge Instructions (Signed)
Head Injury, Adult You have a head injury. Headaches and throwing up (vomiting) are common after a head injury. It should be easy to wake up from sleeping. Sometimes you must stay in the hospital. Most problems happen within the first 24 hours. Side effects may occur up to 7-10 days after the injury.  WHAT ARE THE TYPES OF HEAD INJURIES? Head injuries can be as minor as a bump. Some head injuries can be more severe. More severe head injuries include:  A jarring injury to the brain (concussion).  A bruise of the brain (contusion). This mean there is bleeding in the brain that can cause swelling.  A cracked skull (skull fracture).  Bleeding in the brain that collects, clots, and forms a bump (hematoma). WHEN SHOULD I GET HELP RIGHT AWAY?   You are confused or sleepy.  You cannot be woken up.  You feel sick to your stomach (nauseous) or keep throwing up (vomiting).  Your dizziness or unsteadiness is getting worse.  You have very bad, lasting headaches that are not helped by medicine. Take medicines only as told by your doctor.  You cannot use your arms or legs like normal.  You cannot walk.  You notice changes in the black spots in the center of the colored part of your eye (pupil).  You have clear or bloody fluid coming from your nose or ears.  You have trouble seeing. During the next 24 hours after the injury, you must stay with someone who can watch you. This person should get help right away (call 911 in the [place] if you start to shake and are not able to control it (have seizures), you pass out, or you are unable to wake up. HOW CAN I PREVENT A HEAD INJURY IN THE FUTURE?  Wear seat belts.  Wear a helmet while bike riding and playing sports like football.  Stay away from dangerous activities around the house. WHEN CAN I RETURN TO NORMAL ACTIVITIES AND ATHLETICS? See your doctor before doing these activities. You should not do normal activities or play contact sports until 1  week after the following symptoms have stopped:  Headache that does not go away.  Dizziness.  Poor attention.  Confusion.  Memory problems.  Sickness to your stomach or throwing up.  Tiredness.  Fussiness.  Bothered by bright lights or loud noises.  Anxiousness or depression.  Restless sleep. MAKE SURE YOU:   Understand these instructions.  Will watch your condition.  Will get help right away if you are not doing well or get worse.   This information is not intended to replace advice given to you by your health care provider. Make sure you discuss any questions you have with your health care provider.   Document Released: 06/18/2008 Document Revised: 07/27/2014 Document Reviewed: 03/13/2013 Elsevier Interactive Patient Education 2016 [place], [place], or Adhesive Wound Closure Health care providers use stitches (sutures), staples, and certain glue (skin adhesives) to hold skin together while it heals (wound closure). You may need this treatment after you have surgery or if you cut your skin accidentally. These methods help your skin to heal more quickly and make it less likely that you will have a scar. A wound may take several months to heal completely. The type of wound you have determines when your wound gets closed. In most cases, the wound is closed as soon as possible (primary skin closure). Sometimes, closure is delayed so the wound can be cleaned and allowed to heal  naturally. This reduces the chance of infection. Delayed closure may be needed if your wound:  Is caused by a bite.  Happened more than 6 hours ago.  Involves loss of skin or the tissues under the skin.  Has dirt or debris in it that cannot be removed.  Is infected. WHAT ARE THE DIFFERENT KINDS OF WOUND CLOSURES? There are many options for wound closure. The one that your health care provider uses depends on how deep and how large your wound is. Adhesive Glue To use this type of  glue to close a wound, your health care provider holds the edges of the wound together and paints the glue on the surface of your skin. You may need more than one layer of glue. Then the wound may be covered with a light bandage (dressing). This type of skin closure may be used for small wounds that are not deep (superficial). Using glue for wound closure is less painful than other methods. It does not require a medicine that numbs the area (local anesthetic). This method also leaves nothing to be removed. Adhesive glue is often used for children and on facial wounds. Adhesive glue cannot be used for wounds that are deep, uneven, or bleeding. It is not used inside of a wound.  Adhesive Strips These strips are made of sticky (adhesive), porous paper. They are applied across your skin edges like a regular adhesive bandage. You leave them on until they fall off. Adhesive strips may be used to close very superficial wounds. They may also be used along with sutures to improve the closure of your skin edges.  Sutures Sutures are the oldest method of wound closure. Sutures can be made from natural substances, such as silk, or from synthetic materials, such as nylon and steel. They can be made from a material that your body can break down as your wound heals (absorbable), or they can be made from a material that needs to be removed from your skin (nonabsorbable). They come in many different strengths and sizes. Your health care provider attaches the sutures to a steel needle on one end. Sutures can be passed through your skin, or through the tissues beneath your skin. Then they are tied and cut. Your skin edges may be closed in one continuous stitch or in separate stitches. Sutures are strong and can be used for all kinds of wounds. Absorbable sutures may be used to close tissues under the skin. The disadvantage of sutures is that they may cause skin reactions that lead to infection. Nonabsorbable sutures need to  be removed. Staples When surgical staples are used to close a wound, the edges of your skin on both sides of the wound are brought close together. A staple is placed across the wound, and an instrument secures the edges together. Staples are often used to close surgical cuts (incisions). Staples are faster to use than sutures, and they cause less skin reaction. Staples need to be removed using a tool that bends the staples away from your skin. HOW DO I CARE FOR MY WOUND CLOSURE?  Take medicines only as directed by your health care provider.  If you were prescribed an antibiotic medicine for your wound, finish it all even if you start to feel better.  Use ointments or creams only as directed by your health care provider.  Wash your hands with soap and water before and after touching your wound.  Do not soak your wound in water. Do not take baths, swim, or  use a hot tub until your health care provider approves.  Ask your health care provider when you can start showering. Cover your wound if directed by your health care provider.  Do not take out your own sutures or staples.  Do not pick at your wound. Picking can cause an infection.  Keep all follow-up visits as directed by your health care provider. This is important. HOW LONG WILL I HAVE MY WOUND CLOSURE?  Leave adhesive glue on your skin until the glue peels away.  Leave adhesive strips on your skin until the strips fall off.  Absorbable sutures will dissolve within several days.  Nonabsorbable sutures and staples must be removed. The location of the wound will determine how long they stay in. This can range from several days to a couple of weeks. WHEN SHOULD I SEEK HELP FOR MY WOUND CLOSURE? Contact your health care provider if:  You have a fever.  You have chills.  You have drainage, redness, swelling, or pain at your wound.  There is a bad smell coming from your wound.  The skin edges of your wound start to separate  after your sutures have been removed.  Your wound becomes thick, raised, and darker in color after your sutures come out (scarring).   This information is not intended to replace advice given to you by your health care provider. Make sure you discuss any questions you have with your health care provider.   Document Released: 03/31/2001 Document Revised: 07/27/2014 Document Reviewed: 12/13/2013 Elsevier Interactive Patient Education [employer].

## 2015-05-18 ENCOUNTER — Encounter (HOSPITAL_COMMUNITY): Payer: Self-pay | Admitting: Family Medicine

## 2015-05-18 ENCOUNTER — Emergency Department (HOSPITAL_COMMUNITY)
Admission: EM | Admit: 2015-05-18 | Discharge: 2015-05-18 | Disposition: A | Discharge status: Home / Self Care | Payer: Commercial Managed Care - HMO | Attending: Emergency Medicine | Admitting: Emergency Medicine

## 2015-05-18 DIAGNOSIS — Z4802 Encounter for removal of sutures: Secondary | ICD-10-CM | POA: Diagnosis not present

## 2015-05-18 DIAGNOSIS — E876 Hypokalemia: Secondary | ICD-10-CM | POA: Insufficient documentation

## 2015-05-18 DIAGNOSIS — J449 Chronic obstructive pulmonary disease, unspecified: Secondary | ICD-10-CM | POA: Insufficient documentation

## 2015-05-18 DIAGNOSIS — Z7982 Long term (current) use of aspirin: Secondary | ICD-10-CM | POA: Diagnosis not present

## 2015-05-18 DIAGNOSIS — Z8673 Personal history of transient ischemic attack (TIA), and cerebral infarction without residual deficits: Secondary | ICD-10-CM | POA: Insufficient documentation

## 2015-05-18 DIAGNOSIS — F17210 Nicotine dependence, cigarettes, uncomplicated: Secondary | ICD-10-CM | POA: Insufficient documentation

## 2015-05-18 DIAGNOSIS — I10 Essential (primary) hypertension: Secondary | ICD-10-CM | POA: Diagnosis not present

## 2015-05-18 NOTE — Discharge Instructions (Signed)
Wound Closure Removal °The staples, stitches, or skin adhesives that were used to close your skin have been removed. You will need to continue the care described here until the wound is completely healed and your health care provider confirms that wound care can be stopped. °HOW DO I CARE FOR MY WOUND? °How you care for your wound after the wound closure has been removed depends on the kind of wound closure you had. °Stitches or Staples °· Keep the wound site dry and clean. Do not soak it in water. °· If skin adhesive strips were applied after the staples were removed, they will begin to peel off in a few days. Allow them to remain in place until they fall off on their own. °· If you still have a bandage (dressing), change it at least once a day or as directed by your health care provider. If the dressing sticks, pour warm, sterile water over it until it loosens and can be removed without pulling apart the wound edges. Pat the area dry with a soft, clean towel. Do not rub the wound because that may cause bleeding. °· Apply cream or ointment that stops the growth of bacteria (antibacterial cream or antibacterial ointment) only if your health care provider has directed you to do so. °· Place a nonstick bandage over the wound to prevent the dressing from sticking. °· Cover the nonstick bandage with a new dressing as directed by your health care provider. °· If the bandage becomes wet or dirty or it develops a bad smell, change it as soon as possible. °· Take medicines only as directed by your health care provider. °Adhesive Strips or Glue °· Adhesive strips and glue peel off on their own. °· Leave adhesive strips and glue in place until they fall off. °ARE THERE ANY BATHING RESTRICTIONS ONCE MY WOUND CLOSURE IS REMOVED? °Do not take baths, swim, or use a hot tub until your health care provider approves. °HOW CAN I DECREASE THE SIZE OF MY SCAR? °How your scar heals and the size of your scar depend on many factors, such  as your age, the type of scar you have, and genetic factors. The following may help decrease the size of your scar: °· Sunscreen. Use sunscreen with a sun protection factor (SPF) of at least 15 when out in the sun. Reapply the sunscreen every two hours. °· Friction massage. Once your wound is completely healed, you can gently massage the scarred area. This can decrease scar thickness. °WHEN SHOULD I SEEK HELP?  °Seek help if: °· You have a fever. °· You have chills. °· You have drainage, redness, swelling, or pain at your wound. °· There is a bad smell coming from your wound. °· Your wound edges open up or do not stay closed after the wound closure has been removed. °  °This information is not intended to replace advice given to you by your health care provider. Make sure you discuss any questions you have with your health care provider. °  °Document Released: 06/18/2008 Document Revised: 07/27/2014 Document Reviewed: 11/21/2013 °Elsevier Interactive Patient Education ©2016 Elsevier Inc[employer] ° °
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
## 2015-05-18 NOTE — ED Notes (Signed)
Declined W/C at D/C and was escorted to lobby by RN. 

## 2015-05-18 NOTE — ED Notes (Signed)
Pt here for for staple removal on her head.

## 2015-09-24 DIAGNOSIS — J40 Bronchitis, not specified as acute or chronic: Secondary | ICD-10-CM | POA: Diagnosis not present

## 2015-09-24 DIAGNOSIS — J441 Chronic obstructive pulmonary disease with (acute) exacerbation: Secondary | ICD-10-CM | POA: Diagnosis not present

## 2015-09-25 ENCOUNTER — Other Ambulatory Visit: Payer: Self-pay | Admitting: Internal Medicine

## 2015-09-25 DIAGNOSIS — R059 Cough, unspecified: Secondary | ICD-10-CM

## 2015-09-25 DIAGNOSIS — R05 Cough: Secondary | ICD-10-CM

## 2015-09-26 ENCOUNTER — Other Ambulatory Visit: Payer: Medicare HMO

## 2015-10-03 ENCOUNTER — Ambulatory Visit
Admission: RE | Admit: 2015-10-03 | Discharge: 2015-10-03 | Disposition: A | Discharge status: Home / Self Care | Payer: Commercial Managed Care - HMO | Source: Ambulatory Visit | Attending: Internal Medicine | Admitting: Internal Medicine

## 2015-10-03 DIAGNOSIS — R059 Cough, unspecified: Secondary | ICD-10-CM

## 2015-10-03 DIAGNOSIS — R918 Other nonspecific abnormal finding of lung field: Secondary | ICD-10-CM | POA: Diagnosis not present

## 2015-10-03 DIAGNOSIS — R05 Cough: Secondary | ICD-10-CM

## 2015-10-03 MED ORDER — IOPAMIDOL (ISOVUE-300) INJECTION 61%
75.0000 mL | Freq: Once | INTRAVENOUS | Status: AC | PRN
  Administered 2015-10-03: 75 mL via INTRAVENOUS

## 2015-11-11 DIAGNOSIS — Z7982 Long term (current) use of aspirin: Secondary | ICD-10-CM | POA: Diagnosis not present

## 2015-11-11 DIAGNOSIS — I10 Essential (primary) hypertension: Secondary | ICD-10-CM | POA: Diagnosis not present

## 2015-11-11 DIAGNOSIS — E7800 Pure hypercholesterolemia, unspecified: Secondary | ICD-10-CM | POA: Diagnosis not present

## 2015-11-14 DIAGNOSIS — Z0000 Encounter for general adult medical examination without abnormal findings: Secondary | ICD-10-CM | POA: Diagnosis not present

## 2015-11-14 DIAGNOSIS — Z23 Encounter for immunization: Secondary | ICD-10-CM | POA: Diagnosis not present

## 2015-11-14 DIAGNOSIS — E7800 Pure hypercholesterolemia, unspecified: Secondary | ICD-10-CM | POA: Diagnosis not present

## 2015-11-14 DIAGNOSIS — E876 Hypokalemia: Secondary | ICD-10-CM | POA: Diagnosis not present

## 2015-11-21 DIAGNOSIS — E876 Hypokalemia: Secondary | ICD-10-CM | POA: Diagnosis not present

## 2015-12-02 DIAGNOSIS — H6012 Cellulitis of left external ear: Secondary | ICD-10-CM | POA: Diagnosis not present

## 2015-12-26 DIAGNOSIS — M542 Cervicalgia: Secondary | ICD-10-CM | POA: Diagnosis not present

## 2016-01-06 DIAGNOSIS — M542 Cervicalgia: Secondary | ICD-10-CM | POA: Diagnosis not present

## 2016-01-07 DIAGNOSIS — M542 Cervicalgia: Secondary | ICD-10-CM | POA: Diagnosis not present

## 2016-01-13 DIAGNOSIS — M542 Cervicalgia: Secondary | ICD-10-CM | POA: Diagnosis not present

## 2016-01-15 DIAGNOSIS — M542 Cervicalgia: Secondary | ICD-10-CM | POA: Diagnosis not present

## 2016-01-20 DIAGNOSIS — M542 Cervicalgia: Secondary | ICD-10-CM | POA: Diagnosis not present

## 2016-05-18 DIAGNOSIS — I10 Essential (primary) hypertension: Secondary | ICD-10-CM | POA: Diagnosis not present

## 2016-05-18 DIAGNOSIS — J449 Chronic obstructive pulmonary disease, unspecified: Secondary | ICD-10-CM | POA: Diagnosis not present

## 2016-05-18 DIAGNOSIS — E7800 Pure hypercholesterolemia, unspecified: Secondary | ICD-10-CM | POA: Diagnosis not present

## 2016-08-04 DIAGNOSIS — Z720 Tobacco use: Secondary | ICD-10-CM | POA: Diagnosis not present

## 2016-08-04 DIAGNOSIS — J329 Chronic sinusitis, unspecified: Secondary | ICD-10-CM | POA: Diagnosis not present

## 2016-08-04 DIAGNOSIS — J45909 Unspecified asthma, uncomplicated: Secondary | ICD-10-CM | POA: Diagnosis not present

## 2016-10-10 DIAGNOSIS — J449 Chronic obstructive pulmonary disease, unspecified: Secondary | ICD-10-CM | POA: Diagnosis not present

## 2016-10-15 DIAGNOSIS — R911 Solitary pulmonary nodule: Secondary | ICD-10-CM | POA: Diagnosis not present

## 2016-10-15 DIAGNOSIS — R0602 Shortness of breath: Secondary | ICD-10-CM | POA: Diagnosis not present

## 2016-10-15 DIAGNOSIS — J441 Chronic obstructive pulmonary disease with (acute) exacerbation: Secondary | ICD-10-CM | POA: Diagnosis not present

## 2016-10-20 DIAGNOSIS — R918 Other nonspecific abnormal finding of lung field: Secondary | ICD-10-CM | POA: Diagnosis not present

## 2016-10-20 DIAGNOSIS — R0602 Shortness of breath: Secondary | ICD-10-CM | POA: Diagnosis not present

## 2016-10-20 DIAGNOSIS — B370 Candidal stomatitis: Secondary | ICD-10-CM | POA: Diagnosis not present

## 2016-10-20 DIAGNOSIS — F17200 Nicotine dependence, unspecified, uncomplicated: Secondary | ICD-10-CM | POA: Diagnosis not present

## 2016-10-20 DIAGNOSIS — J449 Chronic obstructive pulmonary disease, unspecified: Secondary | ICD-10-CM | POA: Diagnosis not present

## 2016-10-20 DIAGNOSIS — Z87891 Personal history of nicotine dependence: Secondary | ICD-10-CM | POA: Diagnosis not present

## 2016-10-27 ENCOUNTER — Other Ambulatory Visit: Payer: Self-pay | Admitting: Internal Medicine

## 2016-10-27 DIAGNOSIS — R911 Solitary pulmonary nodule: Secondary | ICD-10-CM

## 2016-10-28 DIAGNOSIS — R918 Other nonspecific abnormal finding of lung field: Secondary | ICD-10-CM | POA: Diagnosis not present

## 2016-10-28 DIAGNOSIS — F17200 Nicotine dependence, unspecified, uncomplicated: Secondary | ICD-10-CM | POA: Diagnosis not present

## 2016-10-28 DIAGNOSIS — J449 Chronic obstructive pulmonary disease, unspecified: Secondary | ICD-10-CM | POA: Diagnosis not present

## 2016-10-28 DIAGNOSIS — Z87891 Personal history of nicotine dependence: Secondary | ICD-10-CM | POA: Diagnosis not present

## 2016-10-29 ENCOUNTER — Ambulatory Visit
Admission: RE | Admit: 2016-10-29 | Discharge: 2016-10-29 | Disposition: A | Discharge status: Home / Self Care | Payer: Commercial Managed Care - HMO | Source: Ambulatory Visit | Attending: Internal Medicine | Admitting: Internal Medicine

## 2016-10-29 DIAGNOSIS — I712 Thoracic aortic aneurysm, without rupture, unspecified: Secondary | ICD-10-CM

## 2016-10-29 DIAGNOSIS — R911 Solitary pulmonary nodule: Secondary | ICD-10-CM

## 2016-10-29 HISTORY — DX: Thoracic aortic aneurysm, without rupture, unspecified: I71.20

## 2016-10-29 HISTORY — DX: Thoracic aortic aneurysm, without rupture: I71.2

## 2016-11-11 DIAGNOSIS — E559 Vitamin D deficiency, unspecified: Secondary | ICD-10-CM | POA: Diagnosis not present

## 2016-11-11 DIAGNOSIS — Z0000 Encounter for general adult medical examination without abnormal findings: Secondary | ICD-10-CM | POA: Diagnosis not present

## 2016-11-11 DIAGNOSIS — M810 Age-related osteoporosis without current pathological fracture: Secondary | ICD-10-CM | POA: Diagnosis not present

## 2016-11-11 DIAGNOSIS — I10 Essential (primary) hypertension: Secondary | ICD-10-CM | POA: Diagnosis not present

## 2016-11-17 DIAGNOSIS — I10 Essential (primary) hypertension: Secondary | ICD-10-CM | POA: Diagnosis not present

## 2016-11-17 DIAGNOSIS — Z0000 Encounter for general adult medical examination without abnormal findings: Secondary | ICD-10-CM | POA: Diagnosis not present

## 2016-11-17 DIAGNOSIS — J449 Chronic obstructive pulmonary disease, unspecified: Secondary | ICD-10-CM | POA: Diagnosis not present

## 2016-11-17 DIAGNOSIS — E7800 Pure hypercholesterolemia, unspecified: Secondary | ICD-10-CM | POA: Diagnosis not present

## 2016-12-02 DIAGNOSIS — R0602 Shortness of breath: Secondary | ICD-10-CM | POA: Diagnosis not present

## 2016-12-02 DIAGNOSIS — J449 Chronic obstructive pulmonary disease, unspecified: Secondary | ICD-10-CM | POA: Diagnosis not present

## 2016-12-02 DIAGNOSIS — R918 Other nonspecific abnormal finding of lung field: Secondary | ICD-10-CM | POA: Diagnosis not present

## 2016-12-02 DIAGNOSIS — Z87891 Personal history of nicotine dependence: Secondary | ICD-10-CM | POA: Diagnosis not present

## 2016-12-02 DIAGNOSIS — E7800 Pure hypercholesterolemia, unspecified: Secondary | ICD-10-CM | POA: Diagnosis not present

## 2016-12-02 DIAGNOSIS — F17200 Nicotine dependence, unspecified, uncomplicated: Secondary | ICD-10-CM | POA: Diagnosis not present

## 2016-12-30 ENCOUNTER — Institutional Professional Consult (permissible substitution) (INDEPENDENT_AMBULATORY_CARE_PROVIDER_SITE_OTHER): Payer: Medicare HMO | Admitting: Surgery

## 2016-12-30 ENCOUNTER — Encounter: Payer: Self-pay | Admitting: Surgery

## 2016-12-30 ENCOUNTER — Encounter: Payer: Self-pay | Admitting: *Deleted

## 2016-12-30 VITALS — BP 127/73 | HR 73 | Resp 18 | Ht 62.0 in | Wt 125.0 lb

## 2016-12-30 DIAGNOSIS — I712 Thoracic aortic aneurysm, without rupture, unspecified: Secondary | ICD-10-CM

## 2016-12-30 DIAGNOSIS — C801 Malignant (primary) neoplasm, unspecified: Secondary | ICD-10-CM

## 2016-12-30 DIAGNOSIS — E559 Vitamin D deficiency, unspecified: Secondary | ICD-10-CM | POA: Insufficient documentation

## 2016-12-30 NOTE — Progress Notes (Signed)
Cardiothoracic Surgery Consultation  PCP is Deland Pretty, MD Referring Provider is Deland Pretty, MD  Chief Complaint  Patient presents with  . TAA    new eval with CT CHEST 10/29/16    HPI:  The patient is a 76 year old woman with a history of smoking and COPD, hypertension, hypercholesterolemia, TIA and cervical cancer in the past who is followed by Dr. Shelia Media. She has been seen by Dr. Verdie Mosher at [facility] for her COPD. She had a CT scan of the chest on 10/03/2015 after an abnormal CXR. This showed an 8 mm indeterminate patchy density at the left apex and a 4 mm left upper lobe lung nodule. She recently had a follow up CT of the chest on 10/29/2016 which showed a fusiform ascending aortic aneurysm measuring 4.0 cm as well as multiple bilateral lung nodules, most of which were stable. There was a single 2 mm nodule in the RLL that appeared more solid compared to a year ago when it was sub-solid. She continues to smoke about 3 cigarettes per day. She had smoked 1 ppd for many years and then quit for two years but restarted after her husband died. She still works in a green house taking care of plants.  Past Medical History:  Diagnosis Date  . Cancer (HCC)    CERVICAL  . COPD (chronic obstructive pulmonary disease) ([place])   . Hypercholesteremia   . Hypertension   . Hypokalemia   . Stroke [facility])    TIA  . Thoracic aortic aneurysm without rupture ([place]) 10/29/2016   PER CT CHEST  . Vitamin D deficiency     Past Surgical History:  Procedure Laterality Date  . ABDOMINAL HYSTERECTOMY     WITH BSO    Family History  Problem Relation Age of Onset  . Myasthenia gravis Daughter   . Epilepsy Son     Social History Social History  Substance Use Topics  . Smoking status: Current Every Day Smoker    Packs/day: 1.00    Years: 60.00    Types: Cigarettes  . Smokeless tobacco: Never Used     Comment: 2-3 CIGARETTES  A  DAY  . Alcohol use No    Current Outpatient  Prescriptions  Medication Sig Dispense Refill  . albuterol (PROVENTIL) (2.5 MG/3ML) 0.083% nebulizer solution Take 2.5 mg by nebulization every 6 (six) hours as needed for wheezing or shortness of breath.    Marland Kitchen aspirin 81 MG tablet Take 81 mg by mouth daily.    . Cholecalciferol (VITAMIN D3) 5000 units CAPS Take 1 capsule by mouth daily.    . fluticasone furoate-vilanterol (BREO ELLIPTA) 100-25 MCG/INH AEPB Inhale 1 puff into the lungs daily.    . simvastatin (ZOCOR) 40 MG tablet Take 40 mg by mouth at bedtime as needed.    . triamterene-hydrochlorothiazide (DYAZIDE) 37.5-25 MG capsule Take 1 capsule by mouth daily.    Marland Kitchen umeclidinium bromide (INCRUSE ELLIPTA) 62.5 MCG/INH AEPB Inhale 1 puff into the lungs daily.     No current facility-administered medications for this visit.     No Known Allergies  Review of Systems  Constitutional: Negative for activity change, appetite change, fatigue, fever and unexpected weight change.  HENT:       Dentures  Eyes: Negative.   Respiratory: Positive for cough. Negative for shortness of breath.   Cardiovascular: Negative for chest pain, palpitations and leg swelling.  Gastrointestinal: Negative.   Endocrine: Negative.   Genitourinary: Negative.   Musculoskeletal:  Positive for myalgias.  Skin: Negative.   Neurological:       Hx of TIA  Hematological: Bruises/bleeds easily.  Psychiatric/Behavioral: Negative.     BP 127/73 (BP Location: Right Arm, Patient Position: Sitting, Cuff Size: Normal)   Pulse 73   Resp 18   Ht 5\' 2"  (1.575 m)   Wt 125 lb (56.7 kg)   SpO2 95% Comment: ON RA  BMI 22.86 kg/m  Physical Exam  Constitutional: She is oriented to person, place, and time. She appears well-developed and well-nourished. No distress.  HENT:  Head: Normocephalic and atraumatic.  Mouth/Throat: Oropharynx is clear and moist.  Eyes: EOM are normal. Pupils are equal, round, and reactive to light.  Neck: Neck supple. No JVD present. No thyromegaly  present.  Cardiovascular: Normal rate, regular rhythm, normal heart sounds and intact distal pulses.   No murmur heard. Pulmonary/Chest: Effort normal. No respiratory distress. She has wheezes.  Abdominal: Soft. Bowel sounds are normal. She exhibits no distension and no mass. There is no tenderness.  Musculoskeletal: Normal range of motion. She exhibits no edema.  Lymphadenopathy:    She has no cervical adenopathy.  Neurological: She is alert and oriented to person, place, and time.  Skin: Skin is warm and dry.  Psychiatric: She has a normal mood and affect.     Diagnostic Tests:   CT CHEST WO CONTRAST (Accession [id number]) (Order [id number])  Imaging  Date: 10/29/2016 Department: [facility] [place] Released By: Shelda Pal Authorizing: Deland Pretty, MD  Exam Information   Status Exam Begun  Exam Ended   Final [99] 10/29/2016 9:20 AM 10/29/2016 9:30 AM  PACS Images   Show images for CT CHEST WO CONTRAST  Study Result   CLINICAL DATA:  Lung nodule, followup, history COPD, cough, occasional shortness of breath with exertion, prior stroke, hypertension, smoker, history of cancer of the cervix in 1972  EXAM: CT CHEST WITHOUT CONTRAST  TECHNIQUE: Multidetector CT imaging of the chest was performed following the standard protocol without IV contrast. Sagittal and coronal MPR images reconstructed from axial data set.  COMPARISON:  10/03/2015  FINDINGS: Cardiovascular: Atherosclerotic calcifications aorta, coronary arteries and proximal great vessels. Aneurysmal dilatation ascending thoracic aorta 4.0 cm transverse image 54. Minimal pericardial fluid or thickening.  Mediastinum/Nodes: Visualized base of cervical region unremarkable. Esophagus normal appearance for technique. Scattered normal sized mediastinal lymph nodes without thoracic adenopathy.  Lungs/Pleura: 3 mm LEFT upper lobe subpleural nodule image 26 stable. Linear  subsegmental atelectasis anteromedial RIGHT upper lobe new. 4 mm posterior subpleural nodule RIGHT upper lobe stable in length, slightly more prominent in conspicuity. Tiny RIGHT lower lobe nodule 2 mm diameter image 62 stable. 2 mm RIGHT lower lobe nodule image 57 previously appeared ground glass. Scarring at medial LEFT upper lobe stable. No additional nodule, infiltrate, pleural effusion or pneumothorax.  Upper Abdomen: Post cholecystectomy. Remaining visualized upper abdomen unremarkable  Musculoskeletal: Diffusely demineralized.  IMPRESSION: Aortic atherosclerosis and coronary arterial calcification with aneurysmal dilatation of the ascending thoracic aorta 4.0 cm transverse, recommendation below.  Recommend annual imaging followup by CTA or MRA. This recommendation follows 2010 ACCF/AHA/AATS/ACR/ASA/SCA/SCAI/SIR/STS/SVM Guidelines for the Diagnosis and Management of Patients with Thoracic Aortic Disease. Circulation. 2010; 121: H741-U384  Multiple BILATERAL pulmonary nodules, majority stable, single nodule in RIGHT lower lobe 2 mm diameter appearing solid whereas previously this appeared sub solid, though this could potentially be related to differences in volume averaging.  Followup CT imaging recommended in 12 months to assess stability of these  findings.  This recommendation follows the consensus statement: Guidelines for Management of Small Pulmonary Nodules Detected on CT Scans: A Statement from the [place] as published in Radiology 2005; 237:395-400.   Electronically Signed   By: Lavonia Dana M.D.   On: 10/29/2016 10:08    Impression:  1. She has a 4.0 cm fusiform ascending aortic aneurysm that I measured at 3.8 cm on CT in 09/2015. This may be slightly larger over the past year but I suspect it is probably stable in size. I stressed the importance of good BP control and smoking cessation. This will require follow up in one year.  2. She has  multiple small bilateral pulmonary nodules that for the most part are stable with the exception of a 2 mm nodule in the RLL that may be more solid. This will require follow up in one year. I stressed the importance of smoking cessation.  I have reviewed the CT images with her and answered all of her questions.    Plan:  I will see her back in one year with a CTA of the chest to reevaluate the aorta and the lung nodules.   I spent 45 minutes performing this consultation and > 50% of this time was spent face to face counseling and coordinating the care of this patient's ascending aortic aneurysm and lung nodules.  Gaye Pollack, MD [facility] [phone]

## 2017-05-13 DIAGNOSIS — E7800 Pure hypercholesterolemia, unspecified: Secondary | ICD-10-CM | POA: Diagnosis not present

## 2017-05-13 DIAGNOSIS — E559 Vitamin D deficiency, unspecified: Secondary | ICD-10-CM | POA: Diagnosis not present

## 2017-05-20 DIAGNOSIS — J449 Chronic obstructive pulmonary disease, unspecified: Secondary | ICD-10-CM | POA: Diagnosis not present

## 2017-05-20 DIAGNOSIS — I10 Essential (primary) hypertension: Secondary | ICD-10-CM | POA: Diagnosis not present

## 2017-05-20 DIAGNOSIS — E559 Vitamin D deficiency, unspecified: Secondary | ICD-10-CM | POA: Diagnosis not present

## 2017-05-20 DIAGNOSIS — E7800 Pure hypercholesterolemia, unspecified: Secondary | ICD-10-CM | POA: Diagnosis not present

## 2017-05-20 DIAGNOSIS — E876 Hypokalemia: Secondary | ICD-10-CM | POA: Diagnosis not present

## 2017-06-14 DIAGNOSIS — E876 Hypokalemia: Secondary | ICD-10-CM | POA: Diagnosis not present

## 2017-10-18 DIAGNOSIS — J019 Acute sinusitis, unspecified: Secondary | ICD-10-CM | POA: Diagnosis not present

## 2017-11-22 DIAGNOSIS — E559 Vitamin D deficiency, unspecified: Secondary | ICD-10-CM | POA: Diagnosis not present

## 2017-11-22 DIAGNOSIS — E7800 Pure hypercholesterolemia, unspecified: Secondary | ICD-10-CM | POA: Diagnosis not present

## 2017-11-22 DIAGNOSIS — I10 Essential (primary) hypertension: Secondary | ICD-10-CM | POA: Diagnosis not present

## 2017-11-25 DIAGNOSIS — I10 Essential (primary) hypertension: Secondary | ICD-10-CM | POA: Diagnosis not present

## 2017-11-25 DIAGNOSIS — E7800 Pure hypercholesterolemia, unspecified: Secondary | ICD-10-CM | POA: Diagnosis not present

## 2017-11-25 DIAGNOSIS — E559 Vitamin D deficiency, unspecified: Secondary | ICD-10-CM | POA: Diagnosis not present

## 2017-11-25 DIAGNOSIS — J449 Chronic obstructive pulmonary disease, unspecified: Secondary | ICD-10-CM | POA: Diagnosis not present

## 2017-11-25 DIAGNOSIS — Z0000 Encounter for general adult medical examination without abnormal findings: Secondary | ICD-10-CM | POA: Diagnosis not present

## 2017-12-09 DIAGNOSIS — J449 Chronic obstructive pulmonary disease, unspecified: Secondary | ICD-10-CM | POA: Diagnosis not present

## 2017-12-10 ENCOUNTER — Other Ambulatory Visit: Payer: Self-pay | Admitting: Surgery

## 2017-12-10 DIAGNOSIS — I712 Thoracic aortic aneurysm, without rupture, unspecified: Secondary | ICD-10-CM

## 2018-01-05 ENCOUNTER — Encounter: Payer: Self-pay | Admitting: Surgery

## 2018-01-05 ENCOUNTER — Ambulatory Visit
Admission: RE | Admit: 2018-01-05 | Discharge: 2018-01-05 | Disposition: A | Discharge status: Home / Self Care | Payer: Medicare HMO | Source: Ambulatory Visit | Attending: Surgery | Admitting: Surgery

## 2018-01-05 ENCOUNTER — Other Ambulatory Visit: Payer: Self-pay

## 2018-01-05 ENCOUNTER — Ambulatory Visit (INDEPENDENT_AMBULATORY_CARE_PROVIDER_SITE_OTHER): Payer: Medicare HMO | Admitting: Surgery

## 2018-01-05 VITALS — BP 130/65 | HR 82 | Resp 16 | Ht 62.0 in | Wt 124.0 lb

## 2018-01-05 DIAGNOSIS — R918 Other nonspecific abnormal finding of lung field: Secondary | ICD-10-CM | POA: Diagnosis not present

## 2018-01-05 DIAGNOSIS — I712 Thoracic aortic aneurysm, without rupture, unspecified: Secondary | ICD-10-CM

## 2018-01-05 MED ORDER — IOPAMIDOL (ISOVUE-370) INJECTION 76%
75.0000 mL | Freq: Once | INTRAVENOUS | Status: AC | PRN
  Administered 2018-01-05: 75 mL via INTRAVENOUS

## 2018-01-05 NOTE — Progress Notes (Signed)
HPI:  This 77 year old woman with a history of smoking and COPD, hypertension, hypercholesterolemia returns for follow-up of a 4.0 cm fusiform ascending aortic aneurysm as well as multiple small bilateral lung nodules.  She continues to smoke about 1 pack of cigarettes over a 4-day period.  She said that she had the flu several weeks ago and is still getting over that.  She has some chronic shortness of breath and wheezing.  She denies any chest pain.  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  Current Outpatient Medications  Medication Sig Dispense Refill  . albuterol (PROVENTIL) (2.5 MG/3ML) 0.083% nebulizer solution Take 2.5 mg by nebulization every 6 (six) hours as needed for wheezing or shortness of breath.    Marland Kitchen aspirin 81 MG tablet Take 81 mg by mouth daily.    . Cholecalciferol (VITAMIN D3) 5000 units CAPS Take 1 capsule by mouth daily.    . fluticasone furoate-vilanterol (BREO ELLIPTA) 100-25 MCG/INH AEPB Inhale 1 puff into the lungs daily.    . simvastatin (ZOCOR) 40 MG tablet Take 40 mg by mouth at bedtime as needed.    . triamterene-hydrochlorothiazide (DYAZIDE) 37.5-25 MG capsule Take 1 capsule by mouth daily.    Marland Kitchen umeclidinium bromide (INCRUSE ELLIPTA) 62.5 MCG/INH AEPB Inhale 1 puff into the lungs daily.     No current facility-administered medications for this visit.      Physical Exam: BP 130/65 (BP Location: Right Arm, Patient Position: Sitting, Cuff Size: Normal)   Pulse 82   Resp 16   Ht 5\' 2"  (1.575 m)   Wt 124 lb (56.2 kg)   SpO2 93% Comment: ON RA  BMI 22.68 kg/m  She looks well. Cardiac exam shows a regular rate and rhythm with normal heart sounds.  There is no murmur. Lung exam reveals bilateral mild wheezing.   Diagnostic Tests:  CLINICAL DATA:  Follow-up thoracic aortic aneurysm. History of COPD.  EXAM: CT ANGIOGRAPHY CHEST WITH CONTRAST  TECHNIQUE: Multidetector CT imaging of the chest was performed using the standard  protocol during bolus administration of intravenous contrast. Multiplanar CT image reconstructions and MIPs were obtained to evaluate the vascular anatomy.  CONTRAST:  74mL ISOVUE-370 IOPAMIDOL (ISOVUE-370) INJECTION 76%  Creatinine was obtained on site at [facility] at [address]  Results: Creatinine 0.9 mg/dL.  COMPARISON:  CT chest dated October 29, 2016.  FINDINGS: Cardiovascular: Normal heart size. No pericardial effusion. The ascending thoracic aorta is grossly unchanged in size, measuring up to 3.9 cm, previously 4.0 cm. Coronary, aortic arch, and branch vessel atherosclerotic vascular disease. No pulmonary embolism.  Mediastinum/Nodes: No enlarged mediastinal, hilar, or axillary lymph nodes. Thyroid gland, trachea, and esophagus demonstrate no significant findings.  Lungs/Pleura: Severe, diffuse peribronchial thickening, significantly worsened when compared to prior study. Scattered areas of airways mucous impaction with very mild ground-glass density and atelectasis in the subpleural anterior right upper lobe. Scattered pulmonary nodules in both lungs measuring up to 4 mm, unchanged. No new pulmonary nodule. Stable scarring in the medial left upper lobe. No focal consolidation, pleural effusion, or pneumothorax.  Upper Abdomen: No acute abnormality.  Musculoskeletal: No chest wall abnormality. No acute or significant osseous findings.  Review of the MIP images confirms the above findings.  IMPRESSION: 1. Grossly unchanged ascending thoracic aorta, measuring up to 3.9 cm, previously 4.0 cm. This remains borderline aneurysmal. Consider continued annual imaging followup by CTA or MRA. This recommendation follows 2010 ACCF/AHA/AATS/ACR/ASA/SCA/SCAI/SIR/STS/SVM Guidelines for the Diagnosis and  Management of Patients with Thoracic Aortic Disease. Circulation. 2010; 121: Z610-R604 2. New severe, diffuse peribronchial thickening with scattered  areas of mucous impaction, likely smoking-related. Mild ground-glass density in the subpleural anterior right upper lobe likely reflects a combination of infection/inflammation and atelectasis. 3. Stable bilateral pulmonary nodules measuring up to 4 mm. No further follow-up is required. 4.  Aortic atherosclerosis (ICD10-I70.0).   Electronically Signed   By: Titus Dubin M.D.   On: 01/05/2018 11:34  Impression:  She has a stable 4.0 cm fusiform ascending aortic aneurysm.  Her blood pressure is under reasonable control and at the size I think it is reasonable to check it again in 2 years.  The multiple small bilateral pulmonary nodules are all stable from her CT scan one year ago and are likely benign.  There is new severe, diffuse peribronchial thickening with scattered areas of mucus impaction that may be related to her smoking as well has her recent bout of flu.  I encouraged her to discontinue smoking.  I stressed the importance of good blood pressure control.  I reviewed the CT scan images with her and answered her questions.  Plan:  I will see her back in 2 years with a CTA of the chest to follow-up on her small fusiform ascending aortic aneurysm.  I spent 15 minutes performing this established patient evaluation and > 50% of this time was spent face to face counseling and coordinating the care of this patient's aortic aneurysm.    Gaye Pollack, MD [facility] [phone]

## 2018-05-24 DIAGNOSIS — E7800 Pure hypercholesterolemia, unspecified: Secondary | ICD-10-CM | POA: Diagnosis not present

## 2018-05-24 DIAGNOSIS — J441 Chronic obstructive pulmonary disease with (acute) exacerbation: Secondary | ICD-10-CM | POA: Diagnosis not present

## 2018-05-31 DIAGNOSIS — E559 Vitamin D deficiency, unspecified: Secondary | ICD-10-CM | POA: Diagnosis not present

## 2018-05-31 DIAGNOSIS — E7800 Pure hypercholesterolemia, unspecified: Secondary | ICD-10-CM | POA: Diagnosis not present

## 2018-05-31 DIAGNOSIS — J449 Chronic obstructive pulmonary disease, unspecified: Secondary | ICD-10-CM | POA: Diagnosis not present

## 2018-05-31 DIAGNOSIS — N390 Urinary tract infection, site not specified: Secondary | ICD-10-CM | POA: Diagnosis not present

## 2018-05-31 DIAGNOSIS — I10 Essential (primary) hypertension: Secondary | ICD-10-CM | POA: Diagnosis not present

## 2018-06-20 DIAGNOSIS — Z1212 Encounter for screening for malignant neoplasm of rectum: Secondary | ICD-10-CM | POA: Diagnosis not present

## 2018-06-20 DIAGNOSIS — Z1211 Encounter for screening for malignant neoplasm of colon: Secondary | ICD-10-CM | POA: Diagnosis not present

## 2018-11-23 DIAGNOSIS — E7800 Pure hypercholesterolemia, unspecified: Secondary | ICD-10-CM | POA: Diagnosis not present

## 2018-11-23 DIAGNOSIS — Z0000 Encounter for general adult medical examination without abnormal findings: Secondary | ICD-10-CM | POA: Diagnosis not present

## 2018-11-23 DIAGNOSIS — E559 Vitamin D deficiency, unspecified: Secondary | ICD-10-CM | POA: Diagnosis not present

## 2018-11-23 DIAGNOSIS — I10 Essential (primary) hypertension: Secondary | ICD-10-CM | POA: Diagnosis not present

## 2018-11-30 DIAGNOSIS — E559 Vitamin D deficiency, unspecified: Secondary | ICD-10-CM | POA: Diagnosis not present

## 2018-11-30 DIAGNOSIS — Z0000 Encounter for general adult medical examination without abnormal findings: Secondary | ICD-10-CM | POA: Diagnosis not present

## 2018-11-30 DIAGNOSIS — M79672 Pain in left foot: Secondary | ICD-10-CM | POA: Diagnosis not present

## 2018-11-30 DIAGNOSIS — J449 Chronic obstructive pulmonary disease, unspecified: Secondary | ICD-10-CM | POA: Diagnosis not present

## 2018-11-30 DIAGNOSIS — B009 Herpesviral infection, unspecified: Secondary | ICD-10-CM | POA: Diagnosis not present

## 2018-11-30 DIAGNOSIS — M79671 Pain in right foot: Secondary | ICD-10-CM | POA: Diagnosis not present

## 2018-11-30 DIAGNOSIS — E7800 Pure hypercholesterolemia, unspecified: Secondary | ICD-10-CM | POA: Diagnosis not present

## 2018-11-30 DIAGNOSIS — E876 Hypokalemia: Secondary | ICD-10-CM | POA: Diagnosis not present

## 2018-12-26 DIAGNOSIS — M2011 Hallux valgus (acquired), right foot: Secondary | ICD-10-CM | POA: Diagnosis not present

## 2018-12-26 DIAGNOSIS — M25572 Pain in left ankle and joints of left foot: Secondary | ICD-10-CM | POA: Diagnosis not present

## 2018-12-26 DIAGNOSIS — M2012 Hallux valgus (acquired), left foot: Secondary | ICD-10-CM | POA: Diagnosis not present

## 2018-12-26 DIAGNOSIS — M65872 Other synovitis and tenosynovitis, left ankle and foot: Secondary | ICD-10-CM | POA: Diagnosis not present

## 2018-12-26 DIAGNOSIS — M79671 Pain in right foot: Secondary | ICD-10-CM | POA: Diagnosis not present

## 2018-12-26 DIAGNOSIS — M79672 Pain in left foot: Secondary | ICD-10-CM | POA: Diagnosis not present

## 2019-01-11 DIAGNOSIS — M25572 Pain in left ankle and joints of left foot: Secondary | ICD-10-CM | POA: Diagnosis not present

## 2019-01-11 DIAGNOSIS — M722 Plantar fascial fibromatosis: Secondary | ICD-10-CM | POA: Diagnosis not present

## 2019-01-11 DIAGNOSIS — M7752 Other enthesopathy of left foot: Secondary | ICD-10-CM | POA: Diagnosis not present

## 2019-02-02 DIAGNOSIS — M722 Plantar fascial fibromatosis: Secondary | ICD-10-CM | POA: Diagnosis not present

## 2019-02-02 DIAGNOSIS — M65872 Other synovitis and tenosynovitis, left ankle and foot: Secondary | ICD-10-CM | POA: Diagnosis not present

## 2019-02-17 DIAGNOSIS — M25572 Pain in left ankle and joints of left foot: Secondary | ICD-10-CM | POA: Diagnosis not present

## 2019-02-17 DIAGNOSIS — M722 Plantar fascial fibromatosis: Secondary | ICD-10-CM | POA: Diagnosis not present

## 2019-03-10 DIAGNOSIS — M722 Plantar fascial fibromatosis: Secondary | ICD-10-CM | POA: Diagnosis not present

## 2019-03-10 DIAGNOSIS — M71572 Other bursitis, not elsewhere classified, left ankle and foot: Secondary | ICD-10-CM | POA: Diagnosis not present

## 2019-05-24 DIAGNOSIS — I10 Essential (primary) hypertension: Secondary | ICD-10-CM | POA: Diagnosis not present

## 2019-05-24 DIAGNOSIS — E559 Vitamin D deficiency, unspecified: Secondary | ICD-10-CM | POA: Diagnosis not present

## 2019-05-24 DIAGNOSIS — E7800 Pure hypercholesterolemia, unspecified: Secondary | ICD-10-CM | POA: Diagnosis not present

## 2019-05-31 DIAGNOSIS — J449 Chronic obstructive pulmonary disease, unspecified: Secondary | ICD-10-CM | POA: Diagnosis not present

## 2019-05-31 DIAGNOSIS — E559 Vitamin D deficiency, unspecified: Secondary | ICD-10-CM | POA: Diagnosis not present

## 2019-05-31 DIAGNOSIS — I10 Essential (primary) hypertension: Secondary | ICD-10-CM | POA: Diagnosis not present

## 2019-05-31 DIAGNOSIS — Z23 Encounter for immunization: Secondary | ICD-10-CM | POA: Diagnosis not present

## 2019-05-31 DIAGNOSIS — E7800 Pure hypercholesterolemia, unspecified: Secondary | ICD-10-CM | POA: Diagnosis not present

## 2019-10-12 DIAGNOSIS — M47812 Spondylosis without myelopathy or radiculopathy, cervical region: Secondary | ICD-10-CM | POA: Diagnosis not present

## 2019-10-12 DIAGNOSIS — G44209 Tension-type headache, unspecified, not intractable: Secondary | ICD-10-CM | POA: Diagnosis not present

## 2019-10-12 DIAGNOSIS — M542 Cervicalgia: Secondary | ICD-10-CM | POA: Diagnosis not present

## 2019-10-12 DIAGNOSIS — J449 Chronic obstructive pulmonary disease, unspecified: Secondary | ICD-10-CM | POA: Diagnosis not present

## 2019-11-29 DIAGNOSIS — H40033 Anatomical narrow angle, bilateral: Secondary | ICD-10-CM | POA: Diagnosis not present

## 2019-11-29 DIAGNOSIS — I10 Essential (primary) hypertension: Secondary | ICD-10-CM | POA: Diagnosis not present

## 2019-11-29 DIAGNOSIS — E7800 Pure hypercholesterolemia, unspecified: Secondary | ICD-10-CM | POA: Diagnosis not present

## 2019-11-29 DIAGNOSIS — E559 Vitamin D deficiency, unspecified: Secondary | ICD-10-CM | POA: Diagnosis not present

## 2019-11-29 DIAGNOSIS — H43393 Other vitreous opacities, bilateral: Secondary | ICD-10-CM | POA: Diagnosis not present

## 2019-12-01 DIAGNOSIS — S32010A Wedge compression fracture of first lumbar vertebra, initial encounter for closed fracture: Secondary | ICD-10-CM | POA: Diagnosis not present

## 2019-12-01 DIAGNOSIS — M549 Dorsalgia, unspecified: Secondary | ICD-10-CM | POA: Diagnosis not present

## 2019-12-06 DIAGNOSIS — M4846XD Fatigue fracture of vertebra, lumbar region, subsequent encounter for fracture with routine healing: Secondary | ICD-10-CM | POA: Diagnosis not present

## 2019-12-06 DIAGNOSIS — Z0000 Encounter for general adult medical examination without abnormal findings: Secondary | ICD-10-CM | POA: Diagnosis not present

## 2019-12-06 DIAGNOSIS — M8580 Other specified disorders of bone density and structure, unspecified site: Secondary | ICD-10-CM | POA: Diagnosis not present

## 2019-12-06 DIAGNOSIS — E7800 Pure hypercholesterolemia, unspecified: Secondary | ICD-10-CM | POA: Diagnosis not present

## 2019-12-06 DIAGNOSIS — Z9181 History of falling: Secondary | ICD-10-CM | POA: Diagnosis not present

## 2019-12-06 DIAGNOSIS — E559 Vitamin D deficiency, unspecified: Secondary | ICD-10-CM | POA: Diagnosis not present

## 2019-12-06 DIAGNOSIS — M810 Age-related osteoporosis without current pathological fracture: Secondary | ICD-10-CM | POA: Diagnosis not present

## 2019-12-06 DIAGNOSIS — J449 Chronic obstructive pulmonary disease, unspecified: Secondary | ICD-10-CM | POA: Diagnosis not present

## 2019-12-19 DIAGNOSIS — M545 Low back pain: Secondary | ICD-10-CM | POA: Diagnosis not present

## 2019-12-19 DIAGNOSIS — M4856XD Collapsed vertebra, not elsewhere classified, lumbar region, subsequent encounter for fracture with routine healing: Secondary | ICD-10-CM | POA: Diagnosis not present

## 2019-12-22 DIAGNOSIS — H43393 Other vitreous opacities, bilateral: Secondary | ICD-10-CM | POA: Diagnosis not present

## 2019-12-22 DIAGNOSIS — H40033 Anatomical narrow angle, bilateral: Secondary | ICD-10-CM | POA: Diagnosis not present

## 2019-12-25 DIAGNOSIS — M5416 Radiculopathy, lumbar region: Secondary | ICD-10-CM | POA: Diagnosis not present

## 2019-12-27 ENCOUNTER — Other Ambulatory Visit: Payer: Self-pay | Admitting: *Deleted

## 2019-12-27 DIAGNOSIS — I712 Thoracic aortic aneurysm, without rupture, unspecified: Secondary | ICD-10-CM

## 2020-01-01 DIAGNOSIS — M545 Low back pain: Secondary | ICD-10-CM | POA: Diagnosis not present

## 2020-01-04 NOTE — Progress Notes (Signed)
[facility] Note  01/08/2020     CHIEF COMPLAINT Patient presents for Retina Evaluation   HISTORY OF PRESENT ILLNESS: Chelsey Young is a 79 y.o. female who presents to the clinic today for:   HPI    Retina Evaluation    In both eyes.  This started 2 months ago.  Duration of 2 months.  Associated Symptoms Floaters.  Context:  distance vision and near vision.  I, the attending physician,  performed the HPI with the patient and updated documentation appropriately.          Comments    CE/IOL OU: Dr. Talbert Forest, approx 3 years ago Pt states she noticed a decrease in her vision OU, approx. 2 months ago.  Pt states she occasionally sees what appears to be a "black cloud".  Patient denies eye pain or discomfort.  Patient denies any family history of glaucoma or macular degeneration.  Patient smokes approximately 1/2 pack of cigarettes per day.       Last edited by Bernarda Caffey, MD on 01/08/2020  8:03 AM. (History)    pt is here on the referral of Dr. Maryjane Hurter for concern of ARMD OU, pt states she had cataract sx with Dr. Talbert Forest about 3 years ago, she states she has noticed a decrease in vision in the left eye especially, over the past 2 months, pt states she got 2 new pairs of glasses from Dr. Marin Comment and could not see out of either one of them  Referring physician: Marin Comment [address], [place],  [place] [id number]  HISTORICAL INFORMATION:   Selected notes from the MEDICAL RECORD NUMBER Referred by Dr. Marin Comment for concern of macular degeneration OU LEE: 12/22/2019 BCVA: 20/60; 20/60   CURRENT MEDICATIONS: No current outpatient medications on file. (Ophthalmic Drugs)   No current facility-administered medications for this visit. (Ophthalmic Drugs)   Current Outpatient Medications (Other)  Medication Sig   albuterol (PROVENTIL) (2.5 MG/3ML) 0.083% nebulizer solution Take 2.5 mg by nebulization every 6 (six) hours as needed for wheezing or shortness of  breath.   aspirin 81 MG tablet Take 81 mg by mouth daily.   Cholecalciferol (VITAMIN D3) 5000 units CAPS Take 1 capsule by mouth daily.   fluticasone furoate-vilanterol (BREO ELLIPTA) 100-25 MCG/INH AEPB Inhale 1 puff into the lungs daily.   simvastatin (ZOCOR) 40 MG tablet Take 40 mg by mouth at bedtime as needed.   triamterene-hydrochlorothiazide (DYAZIDE) 37.5-25 MG capsule Take 1 capsule by mouth daily.   umeclidinium bromide (INCRUSE ELLIPTA) 62.5 MCG/INH AEPB Inhale 1 puff into the lungs daily.   No current facility-administered medications for this visit. (Other)      REVIEW OF SYSTEMS: ROS    Positive for: Eyes   Negative for: Constitutional, Gastrointestinal, Neurological, Skin, Genitourinary, Musculoskeletal, HENT, Endocrine, Cardiovascular, Respiratory, Psychiatric, Allergic/Imm, Heme/Lymph   Last edited by Doneen Poisson on 01/08/2020  7:39 AM. (History)       ALLERGIES No Known Allergies  PAST MEDICAL HISTORY Past Medical History:  Diagnosis Date   Cancer ([place])    CERVICAL   COPD (chronic obstructive pulmonary disease) ([place])    Hypercholesteremia    Hypertension    Hypokalemia    Stroke [facility])    TIA   Thoracic aortic aneurysm without rupture ([place]) 10/29/2016   PER CT CHEST   Vitamin D deficiency    Past Surgical History:  Procedure Laterality Date   ABDOMINAL HYSTERECTOMY  WITH BSO    FAMILY HISTORY Family History  Problem Relation Age of Onset   Myasthenia gravis Daughter    Epilepsy Son     SOCIAL HISTORY Social History   Tobacco Use   Smoking status: Current Every Day Smoker    Packs/day: 1.00    Years: 60.00    Pack years: 60.00    Types: Cigarettes   Smokeless tobacco: Never Used   Tobacco comment: 2-3 CIGARETTES  A  DAY  Substance Use Topics   Alcohol use: No   Drug use: No         OPHTHALMIC EXAM:  Base Eye Exam    Visual Acuity (Snellen - Linear)      Right Left   Dist [place] 20/50 +2 20/60 +1    Dist ph [place] NI NI       Tonometry (Tonopen, 7:54 AM)      Right Left   Pressure 13 13       Pupils      Dark Light Shape React APD   Right 3 2 Round Minimal 0   Left 3 2 Round Minimal 0       Visual Fields      Left Right    Full Full       Extraocular Movement      Right Left    Full Full       Neuro/Psych    Oriented x3: Yes   Mood/Affect: Normal       Dilation    Both eyes: 1.0% Mydriacyl, 2.5% Phenylephrine @ 7:54 AM        Slit Lamp and Fundus Exam    Slit Lamp Exam      Right Left   Lids/Lashes Dermatochalasis - upper lid Dermatochalasis - upper lid   Conjunctiva/Sclera White and quiet White and quiet   Cornea Mild arcus, well healed temporal cataract wounds, 2+ inferior Punctate epithelial erosions Mild arcus, well healed temporal cataract wounds, 3+ inferior Punctate epithelial erosions   Anterior Chamber Deep and quiet Deep and quiet   Iris Round and dilated Round and moderately dilated to 5.92mm   Lens PC IOL in good position PC IOL in good position   Vitreous Vitreous syneresis Vitreous syneresis       Fundus Exam      Right Left   Disc Pink and Sharp Pink and Sharp, Compact   C/D Ratio 0.4 0.3   Macula Flat, Blunted foveal reflex, Drusen, RPE mottling, clumping and atrophy -- early GA Flat, Blunted foveal reflex, Drusen, RPE mottling, clumping and atrophy -- early GA   Vessels Vascular attenuation, mild tortuousity Vascular attenuation, mild tortuousity   Periphery Attached, reticular degeneration, No heme  Attached, reticular degeneration, No heme         Refraction    Wearing Rx      Sphere   Right None   Left None       Manifest Refraction      Sphere Cylinder Axis Dist VA   Right -0.50 +0.50 020 20/40+1   Left -0.50 Sphere            IMAGING AND PROCEDURES  Imaging and Procedures for @TODAY @  OCT, Retina - OU - Both Eyes       Right Eye Quality was good. Central Foveal Thickness: 337. Progression has no prior data. Findings  include abnormal foveal contour, no IRF, no SRF, outer retinal atrophy, retinal drusen , epiretinal membrane.   Left Eye Quality  was good. Central Foveal Thickness: 338. Progression has no prior data. Findings include no IRF, abnormal foveal contour, retinal drusen , no SRF, epiretinal membrane, outer retinal atrophy.   Notes *Images captured and stored on drive  Diagnosis / Impression:  non-exu ARMD with GA OU  Clinical management:  See below  Abbreviations: NFP - Normal foveal profile. CME - cystoid macular edema. PED - pigment epithelial detachment. IRF - intraretinal fluid. SRF - subretinal fluid. EZ - ellipsoid zone. ERM - epiretinal membrane. ORA - outer retinal atrophy. ORT - outer retinal tubulation. SRHM - subretinal hyper-reflective material                 ASSESSMENT/PLAN:    ICD-10-CM   1. Advanced atrophic nonexudative age-related macular degeneration of both eyes with subfoveal involvement  H35.3134   2. Retinal edema  H35.81 OCT, Retina - OU - Both Eyes  3. Essential hypertension  I10   4. Hypertensive retinopathy of both eyes  H35.033   5. Pseudophakia of both eyes  Z96.1     1,2. Age related macular degeneration, non-exudative, both eyes  - advanced atrophic stage w/ +ORA and GA OU  - The incidence, anatomy, and pathology of dry AMD, risk of progression, and the AREDS and AREDS 2 study including smoking risks discussed with patient.  - Recommend amsler grid monitoring  - f/u 3-4 months, DFE, OCT  3,4. Hypertensive retinopathy OU  - discussed importance of tight BP control  - monitor  5. Pseudophakia OU  - s/p CE/IOL OU (Dr. Talbert Forest, 2018)  - IOLs in good position, doing well  - monitor    Ophthalmic Meds Ordered this visit:  No orders of the defined types were placed in this encounter.      Return for f/u 3-4 months, non-exu ARMD OU, DFE, OCT.  There are no Patient Instructions on file for this visit.   Explained the diagnoses, plan, and  follow up with the patient and they expressed understanding.  Patient expressed understanding of the importance of proper follow up care.   This document serves as a record of services personally performed by Gardiner Sleeper, MD, PhD. It was created on their behalf by Leeann Must, [place], a certified ophthalmic assistant. The creation of this record is the provider's dictation and/or activities during the visit.    Electronically signed by: Leeann Must, COA @TODAY @ 12:02 PM   This document serves as a record of services personally performed by Gardiner Sleeper, MD, PhD. It was created on their behalf by Ernest Mallick, OA, an ophthalmic assistant. The creation of this record is the provider's dictation and/or activities during the visit.    Electronically signed by: Ernest Mallick, OA 06.21.2021 12:02 PM   Gardiner Sleeper, M.D., Ph.D. Diseases & Surgery of the Retina and Vitreous [facility]  I have reviewed the above documentation for accuracy and completeness, and I agree with the above. Gardiner Sleeper, M.D., Ph.D. 01/08/20 12:02 PM   Abbreviations: M myopia (nearsighted); A astigmatism; H hyperopia (farsighted); P presbyopia; Mrx spectacle prescription;  CTL contact lenses; OD right eye; OS left eye; OU both eyes  XT exotropia; ET esotropia; PEK punctate epithelial keratitis; PEE punctate epithelial erosions; DES dry eye syndrome; MGD meibomian gland dysfunction; ATs artificial tears; PFAT's preservative free artificial tears; [place] nuclear sclerotic cataract; PSC posterior subcapsular cataract; ERM epi-retinal membrane; PVD posterior vitreous detachment; RD retinal detachment; DM diabetes mellitus; DR diabetic retinopathy; NPDR non-proliferative diabetic retinopathy;  PDR proliferative diabetic retinopathy; CSME clinically significant macular edema; DME diabetic macular edema; dbh dot blot hemorrhages; CWS cotton wool spot; POAG primary open angle glaucoma; C/D cup-to-disc  ratio; HVF humphrey visual field; GVF goldmann visual field; OCT optical coherence tomography; IOP intraocular pressure; BRVO Branch retinal vein occlusion; CRVO central retinal vein occlusion; CRAO central retinal artery occlusion; BRAO branch retinal artery occlusion; RT retinal tear; SB scleral buckle; PPV pars plana vitrectomy; VH Vitreous hemorrhage; PRP panretinal laser photocoagulation; IVK intravitreal kenalog; VMT vitreomacular traction; MH Macular hole;  NVD neovascularization of the disc; NVE neovascularization elsewhere; AREDS age related eye disease study; ARMD age related macular degeneration; POAG primary open angle glaucoma; EBMD epithelial/anterior basement membrane dystrophy; ACIOL anterior chamber intraocular lens; IOL intraocular lens; PCIOL posterior chamber intraocular lens; Phaco/IOL phacoemulsification with intraocular lens placement; [place] photorefractive keratectomy; LASIK laser assisted in situ keratomileusis; HTN hypertension; DM diabetes mellitus; COPD chronic obstructive pulmonary disease.

## 2020-01-08 ENCOUNTER — Encounter (INDEPENDENT_AMBULATORY_CARE_PROVIDER_SITE_OTHER): Payer: Self-pay | Admitting: Ophthalmology

## 2020-01-08 ENCOUNTER — Ambulatory Visit (INDEPENDENT_AMBULATORY_CARE_PROVIDER_SITE_OTHER): Payer: Medicare HMO | Admitting: Ophthalmology

## 2020-01-08 ENCOUNTER — Other Ambulatory Visit: Payer: Self-pay

## 2020-01-08 DIAGNOSIS — Z961 Presence of intraocular lens: Secondary | ICD-10-CM

## 2020-01-08 DIAGNOSIS — H353134 Nonexudative age-related macular degeneration, bilateral, advanced atrophic with subfoveal involvement: Secondary | ICD-10-CM | POA: Diagnosis not present

## 2020-01-08 DIAGNOSIS — H35033 Hypertensive retinopathy, bilateral: Secondary | ICD-10-CM | POA: Diagnosis not present

## 2020-01-08 DIAGNOSIS — H3581 Retinal edema: Secondary | ICD-10-CM | POA: Diagnosis not present

## 2020-01-08 DIAGNOSIS — I10 Essential (primary) hypertension: Secondary | ICD-10-CM

## 2020-01-23 ENCOUNTER — Other Ambulatory Visit: Payer: Self-pay

## 2020-01-23 ENCOUNTER — Ambulatory Visit: Payer: Medicare HMO | Admitting: Vascular Surgery

## 2020-01-23 ENCOUNTER — Encounter: Payer: Self-pay | Admitting: Vascular Surgery

## 2020-01-23 DIAGNOSIS — I714 Abdominal aortic aneurysm, without rupture, unspecified: Secondary | ICD-10-CM

## 2020-01-23 NOTE — Progress Notes (Signed)
Patient name: Chelsey Young MRN: [id number] DOB: Apr 18, 1941 Sex: female  REASON FOR CONSULT: Evaluate saccular abdominal aortic aneurysm  HPI: Chelsey Young is a 79 y.o. female, with history of hypertension, hyperlipidemia, tobacco abuse, COPD, cervical cancer, ascending thoracic aneurysm that presents for evaluation of suspected saccular abdominal aortic aneurysm.  Patient was recently evaluated by [facility] for back fracture.  She reported that she fell off a stool about a month ago and had severe back pain after this.  Ultimately an MRI was ordered of her lumbar spine on 12/25/2019 by Dr. Rolena Infante.  This ultimately showed a saccular aneurysm arising from the infrarenal abdominal aorta measuring 1.8 cm.  She denies any previous history of aneurysmal disease or knowledge of this.  She still smoking about 1/2 pack a day.  She does live independently.  Other findings on her MRI included a acute subacute severe anterior wedge fracture at L1 with mild retropulsion and also moderate to severe right L5-S1 neuroforaminal narrowing with impingement on the nerve root as well as some moderate left L3-L4 and L4-L5 neuroforaminal narrowing with mild to moderate canal stenosis.  Denies any previous abdominal surgery.  Past Medical History:  Diagnosis Date  . Cancer ([facility])    CERVICAL  . COPD (chronic obstructive pulmonary disease) ([place])   . Hypercholesteremia   . Hypertension   . Hypokalemia   . Stroke [facility])    TIA  . Thoracic aortic aneurysm without rupture ([place]) 10/29/2016   PER CT CHEST  . Vitamin D deficiency     Past Surgical History:  Procedure Laterality Date  . ABDOMINAL HYSTERECTOMY     WITH BSO    Family History  Problem Relation Age of Onset  . Myasthenia gravis Daughter   . Epilepsy Son     SOCIAL HISTORY: Social History   Socioeconomic History  . Marital status: Widowed    Spouse name: Not on file  . Number of children: Not on file  . Years of education: Not on  file  . Highest education level: Not on file  Occupational History  . Not on file  Tobacco Use  . Smoking status: Current Every Day Smoker    Packs/day: 1.00    Years: 60.00    Pack years: 60.00    Types: Cigarettes  . Smokeless tobacco: Never Used  . Tobacco comment: 2-3 CIGARETTES  A  DAY  Substance and Sexual Activity  . Alcohol use: No  . Drug use: No  . Sexual activity: Not on file  Other Topics Concern  . Not on file  Social History Narrative  . Not on file   Social Determinants of Health   Financial Resource Strain:   . Difficulty of Paying Living Expenses:   Food Insecurity:   . Worried About Charity fundraiser in the Last Year:   . Arboriculturist in the Last Year:   Transportation Needs:   . Film/video editor (Medical):   Marland Kitchen Lack of Transportation (Non-Medical):   Physical Activity:   . Days of Exercise per Week:   . Minutes of Exercise per Session:   Stress:   . Feeling of Stress :   Social Connections:   . Frequency of Communication with Friends and Family:   . Frequency of Social Gatherings with Friends and Family:   . Attends Religious Services:   . Active Member of Clubs or Organizations:   . Attends Archivist Meetings:   Marland Kitchen Marital Status:  Intimate Partner Violence:   . Fear of Current or Ex-Partner:   . Emotionally Abused:   Marland Kitchen Physically Abused:   . Sexually Abused:     No Known Allergies  Current Outpatient Medications  Medication Sig Dispense Refill  . albuterol (PROVENTIL) (2.5 MG/3ML) 0.083% nebulizer solution Take 2.5 mg by nebulization every 6 (six) hours as needed for wheezing or shortness of breath.    Marland Kitchen aspirin 81 MG tablet Take 81 mg by mouth daily.    . Cholecalciferol (VITAMIN D3) 5000 units CAPS Take 1 capsule by mouth daily.    . fluticasone furoate-vilanterol (BREO ELLIPTA) 100-25 MCG/INH AEPB Inhale 1 puff into the lungs daily.    . simvastatin (ZOCOR) 40 MG tablet Take 40 mg by mouth at bedtime as needed.      . triamterene-hydrochlorothiazide (DYAZIDE) 37.5-25 MG capsule Take 1 capsule by mouth daily.    Marland Kitchen umeclidinium bromide (INCRUSE ELLIPTA) 62.5 MCG/INH AEPB Inhale 1 puff into the lungs daily.     No current facility-administered medications for this visit.    REVIEW OF SYSTEMS:  [X]  denotes positive finding, [ ]  denotes negative finding Cardiac  Comments:  Chest pain or chest pressure:    Shortness of breath upon exertion:    Short of breath when lying flat:    Irregular heart rhythm:        Vascular    Pain in calf, thigh, or hip brought on by ambulation:    Pain in feet at night that wakes you up from your sleep:     Blood clot in your veins:    Leg swelling:         Pulmonary    Oxygen at home:    Productive cough:     Wheezing:         Neurologic    Sudden weakness in arms or legs:     Sudden numbness in arms or legs:     Sudden onset of difficulty speaking or slurred speech:    Temporary loss of vision in one eye:     Problems with dizziness:         Gastrointestinal    Blood in stool:     Vomited blood:         Genitourinary    Burning when urinating:     Blood in urine:        Psychiatric    Major depression:         Hematologic    Bleeding problems:    Problems with blood clotting too easily:        Skin    Rashes or ulcers:        Constitutional    Fever or chills:      PHYSICAL EXAM: Vitals:   01/23/20 1010  BP: (!) 161/76  Pulse: 66  Resp: 14  Temp: 97.7 F (36.5 C)  TempSrc: Temporal  SpO2: 98%  Weight: 116 lb (52.6 kg)  Height: 5' (1.524 m)    GENERAL: The patient is a well-nourished female, in no acute distress. The vital signs are documented above. CARDIAC: There is a regular rate and rhythm.  VASCULAR:  Palpable femoral pulses bilaterally Palpable dorsalis pedis pulses bilaterally PULMONARY: There is good air exchange bilaterally without wheezing or rales. ABDOMEN: Soft and non-tender with normal pitched bowel sounds.   MUSCULOSKELETAL: There are no major deformities or cyanosis. NEUROLOGIC: No focal weakness or paresthesias are detected. SKIN: There are no ulcers or rashes noted. PSYCHIATRIC: The  patient has a normal affect.  DATA:   I independently reviewed her MRI lumbar spine and agree there does appear to be a small saccular aneurysm arising from the infrarenal abdominal aorta below the renal arteries approximately at the level of L3.  Assessment/Plan:  79 year old female presents for evaluation of incidentally discovered saccular aneurysm arising from infrarenal abdominal aorta on MRI for further evaluation of a wedge fracture at L1 following a fall off a stool.  She is having back pain but I think this is from her L1 compression fracture.  I discussed that saccular aneurysms are higher risk morphology for rupture.  She is scheduled to get a CTA chest tomorrow for surveillance of an ascending aneurysm and followed by Dr. Cyndia Bent with CT surgery.  I am going to add on a CTA abdomen pelvis to further evaluate the saccular aneurysm.  I will have her follow-up with me afterwards.  Discussed we may continue surveillance versus seeing if she is a candidate for stent graft pending what her CT looks like.  Questions and concerns answered.   Marty Heck, MD [facility] Office: [phone]

## 2020-01-24 ENCOUNTER — Ambulatory Visit: Payer: Medicare HMO | Admitting: Surgery

## 2020-01-24 ENCOUNTER — Ambulatory Visit
Admission: RE | Admit: 2020-01-24 | Discharge: 2020-01-24 | Disposition: A | Discharge status: Home / Self Care | Payer: Medicare HMO | Source: Ambulatory Visit | Attending: Surgery | Admitting: Surgery

## 2020-01-24 ENCOUNTER — Encounter: Payer: Self-pay | Admitting: Surgery

## 2020-01-24 ENCOUNTER — Ambulatory Visit
Admission: RE | Admit: 2020-01-24 | Discharge: 2020-01-24 | Disposition: A | Discharge status: Home / Self Care | Payer: Medicare HMO | Source: Ambulatory Visit | Attending: Vascular Surgery | Admitting: Vascular Surgery

## 2020-01-24 VITALS — BP 138/74 | HR 77 | Temp 98.1°F | Resp 20 | Ht 60.0 in | Wt 117.5 lb

## 2020-01-24 DIAGNOSIS — I712 Thoracic aortic aneurysm, without rupture, unspecified: Secondary | ICD-10-CM

## 2020-01-24 DIAGNOSIS — I714 Abdominal aortic aneurysm, without rupture, unspecified: Secondary | ICD-10-CM

## 2020-01-24 MED ORDER — IOPAMIDOL (ISOVUE-370) INJECTION 76%
60.0000 mL | Freq: Once | INTRAVENOUS | Status: AC | PRN
  Administered 2020-01-24: 11:00:00 60 mL via INTRAVENOUS

## 2020-01-24 NOTE — Progress Notes (Signed)
HPI:  The patient is an 79 year old woman who returns today for follow-up of a 3.9 cm fusiform ascending aortic aneurysm.  I last saw her on 01/05/2018.  She was recently evaluated for a vertebral fracture after falling off a stool and saw Dr. Rolena Infante of orthopedic surgery.  An MRI of the lumbar spine on 12/25/2019 showed an incidental saccular aneurysm of the infrarenal abdominal aorta was measured at 1.8 cm.  She was seen yesterday by Dr. Fortunato Curling for vascular surgery evaluation.  CTA of the abdomen pelvis was added to our CTA of the chest today.  Other than her lower spine fracture causing pain she has been doing well overall.  Current Outpatient Medications  Medication Sig Dispense Refill  . albuterol (PROVENTIL) (2.5 MG/3ML) 0.083% nebulizer solution Take 2.5 mg by nebulization every 6 (six) hours as needed for wheezing or shortness of breath.    Marland Kitchen aspirin 81 MG tablet Take 81 mg by mouth daily.    . Cholecalciferol (VITAMIN D3) 5000 units CAPS Take 1 capsule by mouth daily.    . fluticasone furoate-vilanterol (BREO ELLIPTA) 100-25 MCG/INH AEPB Inhale 1 puff into the lungs daily.    . simvastatin (ZOCOR) 40 MG tablet Take 40 mg by mouth at bedtime as needed.    . triamterene-hydrochlorothiazide (DYAZIDE) 37.5-25 MG capsule Take 1 capsule by mouth daily.     No current facility-administered medications for this visit.     Physical Exam: BP 138/74 (BP Location: Right Arm, Patient Position: Sitting, Cuff Size: Normal)   Pulse 77   Temp 98.1 F (36.7 C) (Temporal)   Resp 20   Ht 5' (1.524 m)   Wt 117 lb 8 oz (53.3 kg)   SpO2 96% Comment: RA  BMI 22.95 kg/m  She looks well. Cardiac exam shows regular rate and rhythm with normal heart sounds.  There is no murmur. Lung exam is clear.  Diagnostic Tests:  CLINICAL DATA:  Thoracic aortic aneurysm, and abdominal aortic aneurysm  EXAM: CT ANGIOGRAPHY CHEST, ABDOMEN AND PELVIS  TECHNIQUE: Multidetector CT imaging through the  chest, abdomen and pelvis was performed using the standard protocol during bolus administration of intravenous contrast. Multiplanar reconstructed images and MIPs were obtained and reviewed to evaluate the vascular anatomy.  CONTRAST:  67mL ISOVUE-370 IOPAMIDOL (ISOVUE-370) INJECTION 76%  Creatinine was obtained on site at [facility] at [address]  Results: Creatinine 1.4 mg/dL.  COMPARISON:  01/05/2018  FINDINGS: CTA CHEST FINDINGS  Cardiovascular: Similar mild fusiform aneurysmal dilatation of the ascending thoracic aorta, maximal diameter 3.7 cm, previously 3.9 cm. No associated acute dissection, focal wall thickening, mediastinal hemorrhage, or hematoma. Normal heart size. No pericardial effusion. Native coronary atherosclerosis and mitral valve calcifications noted.  Atherosclerotic changes of the aorta. Patent 3 vessel arch anatomy. No pulmonary embolus evident.  Mediastinum/Nodes: No enlarged mediastinal, hilar, or axillary lymph nodes. Thyroid gland, trachea, and esophagus demonstrate no significant findings.  Lungs/Pleura: Chronic left upper lobe subpleural scarring medially. Scattered diffuse bilateral chronic appearing bronchial wall thickening. Suspect mild mucous plugging/retained secretions in the right lower lobe bronchi, image 83 series 14. Right lower lobe subsegmental atelectasis noted. No definite focal pneumonia, collapse or consolidation. No interstitial process or edema.  No pleural abnormality, effusion or pneumothorax.  Musculoskeletal: Degenerative changes of the spine. Intact thoracic spine. Sternum intact.  Review of the MIP images confirms the above findings.  CTA ABDOMEN AND PELVIS FINDINGS  VASCULAR  Aorta: Diffuse atherosclerotic changes of the abdominal aorta.  Infrarenal aorta demonstrates a left focal saccular type vascular outpouching with wall calcification and mural thrombus having a more chronic  appearance. The focal outpouching itself measures 2 cm in greatest dimension and is most compatible with a chronic appearing focal saccular aneurysm versus large penetrating atherosclerotic ulcer. Maximal diameter including the native aorta is 3 cm. No surrounding inflammatory component. No evidence of rupture or retroperitoneal hemorrhage.  Celiac: Minor atherosclerotic origin but remains patent including its branches  SMA: Minor atherosclerotic origin but remains patent including its branches. Replaced right hepatic artery noted from the SMA.  Renals: Minor atherosclerotic origins but remain patent. Accessory left renal artery noted to the lower pole.  IMA: Remains patent off the distal aorta just below the saccular aneurysm.  Inflow: Atherosclerotic changes and tortuosity without occlusion or inflow disease.  Veins: Dedicated venous imaging not performed  Review of the MIP images confirms the above findings.  NON-VASCULAR  Hepatobiliary: No large focal abnormality. Remote cholecystectomy. Mild biliary dilatation, suspect postsurgical.  Pancreas: Unremarkable. No pancreatic ductal dilatation or surrounding inflammatory changes.  Spleen: Normal in size without focal abnormality.  Adrenals/Urinary Tract: Nonspecific slight adrenal nodularity. No acute renal abnormality or hydronephrosis. Small right lower pole exophytic renal cyst. Ureters are symmetric and decompressed. Bladder unremarkable.  Stomach/Bowel: Negative for bowel obstruction, significant dilatation, ileus, or free air. Hepatic flexure transverse colon is interposed anteriorly over the liver. Appendix unremarkable. Scattered colonic diverticulosis most pronounced in the sigmoid and rectum. No acute inflammatory process.  No free fluid, fluid collection, hemorrhage, hematoma, or abscess.  Lymphatic: No bulky adenopathy  Reproductive: Previous hysterectomy  Other: Intact abdominal wall.   No hernia.  No ascites.  Musculoskeletal: Degenerative changes noted of the spine. Acute appearing compression fracture at L1. Significant height loss, greater than 50%. No significant retropulsion or canal stenosis appreciated. Advanced lumbar degenerative changes from L3-S1.  Review of the MIP images confirms the above findings.  IMPRESSION: Stable mild aneurysmal dilatation of the ascending thoracic aorta, maximal diameter 3.7 cm.  No other acute intrathoracic finding  Chronic bronchial wall thickening and suspect scattered areas of retained secretions or mild mucous plugging, most pronounced in the right lower lobe.  Infrarenal abdominal aortic chronic appearing saccular aneurysm versus large chronic penetrating atherosclerotic ulcer. Abnormality measures 2 cm in maximal diameter. Including the native aorta maximal diameter of 3 cm.  Mesenteric and renal vasculature remain patent.  No other acute intra-abdominal or pelvic vascular finding.  Acute appearing L1 compression fracture.  Chronic and postoperative findings as above.  These results will be called to the ordering clinician or representative by the Radiologist Assistant, and communication documented in the PACS or [employer].   Electronically Signed   By: Jerilynn Mages.  Shick M.D.   On: 01/24/2020 12:24   Impression:  This 79 year old woman has a stable 3.7 cm fusiform ascending aortic aneurysm that was measured at 3.9 cm 2 years ago and appears stable dating back to 2017.  Her descending aorta at the same level measures about 2.2 cm.  This is well below the surgical threshold.  I discussed the importance of good blood pressure control and preventing further enlargement and acute aortic dissection.  I recommend that she have a follow-up scan in about 2 years.  The CT of her abdomen pelvis shows a 2 cm saccular aneurysm of the infrarenal abdominal aorta.  She has a follow-up appointment with Dr. Carlis Abbott  in a few weeks to review the results and discuss treatment.  I reviewed the CT  images with her and answered her questions.  Plan:  She will follow up with Dr. Carlis Abbott in a few weeks and I will see her back in 2 years with a CTA of the chest.  I spent 20 minutes performing this established patient evaluation and > 50% of this time was spent face to face counseling and coordinating the care of this patient's aortic aneurysm.    Gaye Pollack, MD [facility] [phone]

## 2020-01-30 DIAGNOSIS — M5136 Other intervertebral disc degeneration, lumbar region: Secondary | ICD-10-CM | POA: Diagnosis not present

## 2020-01-30 DIAGNOSIS — M4856XS Collapsed vertebra, not elsewhere classified, lumbar region, sequela of fracture: Secondary | ICD-10-CM | POA: Diagnosis not present

## 2020-02-13 ENCOUNTER — Encounter: Payer: Self-pay | Admitting: Vascular Surgery

## 2020-02-13 ENCOUNTER — Other Ambulatory Visit: Payer: Self-pay

## 2020-02-13 ENCOUNTER — Ambulatory Visit (INDEPENDENT_AMBULATORY_CARE_PROVIDER_SITE_OTHER): Payer: Medicare HMO | Admitting: Vascular Surgery

## 2020-02-13 VITALS — BP 144/69 | HR 60 | Temp 97.5°F | Resp 14 | Ht 60.0 in | Wt 116.0 lb

## 2020-02-13 DIAGNOSIS — I714 Abdominal aortic aneurysm, without rupture, unspecified: Secondary | ICD-10-CM

## 2020-02-13 NOTE — Progress Notes (Signed)
Patient name: Chelsey Young MRN: [id number] DOB: 1940/09/06 Sex: female  REASON FOR CONSULT: Follow-up after CTA to evaluate saccular abdominal aortic aneurysm  HPI: Chelsey Young is a 79 y.o. female, with history of hypertension, hyperlipidemia, tobacco abuse, COPD, cervical cancer, ascending thoracic aneurysm that presents for follow-up after CTA to discuss suspected saccular abdominal aortic aneurysm.    As previously noted, patient was evaluated by [facility] for back fracture.  She reported that she fell off a stool and had severe back pain after this.  Ultimately an MRI was ordered of her lumbar spine on 12/25/2019 by Dr. Rolena Infante.  This ultimately showed a saccular aneurysm arising from the infrarenal abdominal aorta measuring 1.8 cm.  She denies any previous history of aneurysmal disease or knowledge of this.  She still smoking about 1/2 pack a day.  She does live independently.  Other findings on her MRI included a acute subacute severe anterior wedge fracture at L1.  She reports no new issues since follow-up other than she states Dr. Rolena Infante told her her back fracture completely collapsed.  She continues to take aspirin daily.  Still smoking.  No worsening of her abdominal pain or back pain and at baseline.  Past Medical History:  Diagnosis Date  . Cancer (HCC)    CERVICAL  . COPD (chronic obstructive pulmonary disease) ([place])   . Hypercholesteremia   . Hypertension   . Hypokalemia   . Stroke [facility])    TIA  . Thoracic aortic aneurysm without rupture ([place]) 10/29/2016   PER CT CHEST  . Vitamin D deficiency     Past Surgical History:  Procedure Laterality Date  . ABDOMINAL HYSTERECTOMY     WITH BSO    Family History  Problem Relation Age of Onset  . Myasthenia gravis Daughter   . Epilepsy Son     SOCIAL HISTORY: Social History   Socioeconomic History  . Marital status: Widowed    Spouse name: Not on file  . Number of children: Not on file  . Years of  education: Not on file  . Highest education level: Not on file  Occupational History  . Not on file  Tobacco Use  . Smoking status: Current Every Day Smoker    Packs/day: 1.00    Years: 60.00    Pack years: 60.00    Types: Cigarettes  . Smokeless tobacco: Never Used  . Tobacco comment: 2-3 CIGARETTES  A  DAY  Substance and Sexual Activity  . Alcohol use: No  . Drug use: No  . Sexual activity: Not on file  Other Topics Concern  . Not on file  Social History Narrative  . Not on file   Social Determinants of Health   Financial Resource Strain:   . Difficulty of Paying Living Expenses:   Food Insecurity:   . Worried About Charity fundraiser in the Last Year:   . Arboriculturist in the Last Year:   Transportation Needs:   . Film/video editor (Medical):   Marland Kitchen Lack of Transportation (Non-Medical):   Physical Activity:   . Days of Exercise per Week:   . Minutes of Exercise per Session:   Stress:   . Feeling of Stress :   Social Connections:   . Frequency of Communication with Friends and Family:   . Frequency of Social Gatherings with Friends and Family:   . Attends Religious Services:   . Active Member of Clubs or Organizations:   . Attends  Club or Organization Meetings:   Marland Kitchen Marital Status:   Intimate Partner Violence:   . Fear of Current or Ex-Partner:   . Emotionally Abused:   Marland Kitchen Physically Abused:   . Sexually Abused:     No Known Allergies  Current Outpatient Medications  Medication Sig Dispense Refill  . albuterol (PROVENTIL) (2.5 MG/3ML) 0.083% nebulizer solution Take 2.5 mg by nebulization every 6 (six) hours as needed for wheezing or shortness of breath.    Marland Kitchen aspirin 81 MG tablet Take 81 mg by mouth daily.    . Cholecalciferol (VITAMIN D3) 5000 units CAPS Take 1 capsule by mouth daily.    . fluticasone furoate-vilanterol (BREO ELLIPTA) 100-25 MCG/INH AEPB Inhale 1 puff into the lungs daily.    . simvastatin (ZOCOR) 40 MG tablet Take 40 mg by mouth at  bedtime as needed.    . triamterene-hydrochlorothiazide (DYAZIDE) 37.5-25 MG capsule Take 1 capsule by mouth daily.     No current facility-administered medications for this visit.    REVIEW OF SYSTEMS:  [X]  denotes positive finding, [ ]  denotes negative finding Cardiac  Comments:  Chest pain or chest pressure:    Shortness of breath upon exertion:    Short of breath when lying flat:    Irregular heart rhythm:        Vascular    Pain in calf, thigh, or hip brought on by ambulation:    Pain in feet at night that wakes you up from your sleep:     Blood clot in your veins:    Leg swelling:         Pulmonary    Oxygen at home:    Productive cough:     Wheezing:         Neurologic    Sudden weakness in arms or legs:     Sudden numbness in arms or legs:     Sudden onset of difficulty speaking or slurred speech:    Temporary loss of vision in one eye:     Problems with dizziness:         Gastrointestinal    Blood in stool:     Vomited blood:         Genitourinary    Burning when urinating:     Blood in urine:        Psychiatric    Major depression:         Hematologic    Bleeding problems:    Problems with blood clotting too easily:        Skin    Rashes or ulcers:        Constitutional    Fever or chills:      PHYSICAL EXAM: Vitals:   02/13/20 0845  BP: (!) 144/69  Pulse: 60  Resp: 14  Temp: (!) 97.5 F (36.4 C)  TempSrc: Temporal  SpO2: 99%  Weight: 116 lb (52.6 kg)  Height: 5' (1.524 m)    GENERAL: The patient is a well-nourished female, in no acute distress. The vital signs are documented above. CARDIAC: There is a regular rate and rhythm.  VASCULAR:  Palpable femoral pulses bilaterally Palpable dorsalis pedis pulses bilaterally PULMONARY: There is good air exchange bilaterally without wheezing or rales. ABDOMEN: Soft and non-tender with normal pitched bowel sounds.    DATA:    CTA abdomen pelvis from 01/24/2020 there is a saccular aneurysm  arising from the infrarenal aorta.  Assessment/Plan:  79 year old female presents for follow-up to discuss CTA abdomen  pelvis after incidentally discovered saccular aneurysm arising from infrarenal abdominal aorta on MRI.  After review of CTA I have recommended repair with endovascular stent graft.  Likely use one or two aortic cuffs with transfermoral access.  We discussed risks and benefits including risk of groin complication including bleeding, vessel injury, risk of anesthesia, risk of MI etc.  She wishes to proceed.  We will get her scheduled was soon as we have availability.   Marty Heck, MD [facility] Office: [phone]

## 2020-02-19 NOTE — Progress Notes (Signed)
Your procedure is scheduled on Thursday, August 12th.  Report to [facility] Main Entrance "A" at 7:45 A.M., and check in at the Admitting office.  Call this number if you have problems the morning of surgery:  [phone]  Call [phone] if you have any questions prior to your surgery date Monday-Friday 8am-4pm   Remember:  Do not eat or drink after midnight the night before your surgery    Take these medicines the morning of surgery with A SIP OF WATER  TRELEGY ELLIPTA/inhaler   If needed - albuterol (PROVENTIL)/nebulizer    Follow your surgeon's instructions on when to stop Aspirin.  If no instructions were given by your surgeon then you will need to call the office to get those instructions.    As of today, STOP taking Aleve, Naproxen, Ibuprofen, Motrin, Advil, Goody's, BC's, all herbal medications, fish oil, and all vitamins.                     Do not wear jewelry, make up, or nail polish            Do not wear lotions, powders, perfumes, or deodorant.            Do not shave 48 hours prior to surgery.              Do not bring valuables to the hospital.            [facility] is not responsible for any belongings or valuables.  Do NOT Smoke (Tobacco/Vaping) or drink Alcohol 24 hours prior to your procedure If you use a CPAP at night, you may bring all equipment for your overnight stay.   Contacts, glasses, dentures or bridgework may not be worn into surgery.      For patients admitted to the hospital, discharge time will be determined by your treatment team.   Patients discharged the day of surgery will not be allowed to drive home, and someone needs to stay with them for 24 hours.  Special instructions:   [place]- Preparing For Surgery  Before surgery, you can play an important role. Because skin is not sterile, your skin needs to be as free of germs as possible. You can reduce the number of germs on your skin by washing with CHG (chlorahexidine gluconate) Soap  before surgery.  CHG is an antiseptic cleaner which kills germs and bonds with the skin to continue killing germs even after washing.    Oral Hygiene is also important to reduce your risk of infection.  Remember - BRUSH YOUR TEETH THE MORNING OF SURGERY WITH YOUR REGULAR TOOTHPASTE  Please do not use if you have an allergy to CHG or antibacterial soaps. If your skin becomes reddened/irritated stop using the CHG.  Do not shave (including legs and underarms) for at least 48 hours prior to first CHG shower. It is OK to shave your face.  Please follow these instructions carefully.   1. Shower the NIGHT BEFORE SURGERY and the MORNING OF SURGERY with CHG Soap.   2. If you chose to wash your hair, wash your hair first as usual with your normal shampoo.  3. After you shampoo, rinse your hair and body thoroughly to remove the shampoo.  4. Use CHG as you would any other liquid soap. You can apply CHG directly to the skin and wash gently with a scrungie or a clean washcloth.   5. Apply the CHG Soap to your body ONLY FROM THE  NECK DOWN.  Do not use on open wounds or open sores. Avoid contact with your eyes, ears, mouth and genitals (private parts). Wash Face and genitals (private parts)  with your normal soap.   6. Wash thoroughly, paying special attention to the area where your surgery will be performed.  7. Thoroughly rinse your body with warm water from the neck down.  8. DO NOT shower/wash with your normal soap after using and rinsing off the CHG Soap.  9. Pat yourself dry with a CLEAN TOWEL.  10. Wear CLEAN PAJAMAS to bed the night before surgery  11. Place CLEAN SHEETS on your bed the night of your first shower and DO NOT SLEEP WITH PETS.  Day of Surgery: Wear Clean/Comfortable clothing the morning of surgery Do not apply any deodorants/lotions.   Remember to brush your teeth WITH YOUR REGULAR TOOTHPASTE.   Please read over the following fact sheets that you were given.

## 2020-02-20 ENCOUNTER — Other Ambulatory Visit: Payer: Self-pay

## 2020-02-20 ENCOUNTER — Encounter (HOSPITAL_COMMUNITY)
Admission: RE | Admit: 2020-02-20 | Discharge: 2020-02-20 | Disposition: A | Discharge status: Home / Self Care | Payer: Medicare HMO | Source: Ambulatory Visit | Attending: Vascular Surgery | Admitting: Vascular Surgery

## 2020-02-20 ENCOUNTER — Encounter (HOSPITAL_COMMUNITY): Payer: Self-pay

## 2020-02-20 DIAGNOSIS — E7800 Pure hypercholesterolemia, unspecified: Secondary | ICD-10-CM | POA: Insufficient documentation

## 2020-02-20 DIAGNOSIS — Z79899 Other long term (current) drug therapy: Secondary | ICD-10-CM | POA: Insufficient documentation

## 2020-02-20 DIAGNOSIS — Z7982 Long term (current) use of aspirin: Secondary | ICD-10-CM | POA: Diagnosis not present

## 2020-02-20 DIAGNOSIS — M5136 Other intervertebral disc degeneration, lumbar region: Secondary | ICD-10-CM | POA: Diagnosis not present

## 2020-02-20 DIAGNOSIS — I714 Abdominal aortic aneurysm, without rupture: Secondary | ICD-10-CM | POA: Diagnosis not present

## 2020-02-20 DIAGNOSIS — J449 Chronic obstructive pulmonary disease, unspecified: Secondary | ICD-10-CM | POA: Diagnosis not present

## 2020-02-20 DIAGNOSIS — Z8673 Personal history of transient ischemic attack (TIA), and cerebral infarction without residual deficits: Secondary | ICD-10-CM | POA: Insufficient documentation

## 2020-02-20 DIAGNOSIS — Z01818 Encounter for other preprocedural examination: Secondary | ICD-10-CM | POA: Insufficient documentation

## 2020-02-20 DIAGNOSIS — I10 Essential (primary) hypertension: Secondary | ICD-10-CM | POA: Diagnosis not present

## 2020-02-20 HISTORY — DX: Unspecified osteoarthritis, unspecified site: M19.90

## 2020-02-20 LAB — URINALYSIS, ROUTINE W REFLEX MICROSCOPIC
Bilirubin Urine: NEGATIVE
Glucose, UA: NEGATIVE mg/dL
Hgb urine dipstick: NEGATIVE
Ketones, ur: NEGATIVE mg/dL
Leukocytes,Ua: NEGATIVE
Nitrite: NEGATIVE
Protein, ur: NEGATIVE mg/dL
Specific Gravity, Urine: 1.013 (ref 1.005–1.030)
pH: 6 (ref 5.0–8.0)

## 2020-02-20 LAB — COMPREHENSIVE METABOLIC PANEL
ALT: 21 U/L (ref 0–44)
AST: 19 U/L (ref 15–41)
Albumin: 4 g/dL (ref 3.5–5.0)
Alkaline Phosphatase: 87 U/L (ref 38–126)
Anion gap: 9 (ref 5–15)
BUN: 24 mg/dL — ABNORMAL HIGH (ref 8–23)
CO2: 28 mmol/L (ref 22–32)
Calcium: 9.7 mg/dL (ref 8.9–10.3)
Chloride: 102 mmol/L (ref 98–111)
Creatinine, Ser: 1.25 mg/dL — ABNORMAL HIGH (ref 0.44–1.00)
GFR calc Af Amer: 47 mL/min — ABNORMAL LOW (ref >60)
GFR calc non Af Amer: 40 mL/min — ABNORMAL LOW (ref >60)
Glucose, Bld: 86 mg/dL (ref 70–99)
Potassium: 3.3 mmol/L — ABNORMAL LOW (ref 3.5–5.1)
Sodium: 139 mmol/L (ref 135–145)
Total Bilirubin: 0.8 mg/dL (ref 0.3–1.2)
Total Protein: 7.3 g/dL (ref 6.5–8.1)

## 2020-02-20 LAB — PROTIME-INR
INR: 1 (ref 0.8–1.2)
Prothrombin Time: 12.7 seconds (ref 11.4–15.2)

## 2020-02-20 LAB — CBC
HCT: 44.1 % (ref 36.0–46.0)
Hemoglobin: 13.6 g/dL (ref 12.0–15.0)
MCH: 29.1 pg (ref 26.0–34.0)
MCHC: 30.8 g/dL (ref 30.0–36.0)
MCV: 94.4 fL (ref 80.0–100.0)
Platelets: 255 10*3/uL (ref 150–400)
RBC: 4.67 MIL/uL (ref 3.87–5.11)
RDW: 13.2 % (ref 11.5–15.5)
WBC: 8 10*3/uL (ref 4.0–10.5)
nRBC: 0 % (ref 0.0–0.2)

## 2020-02-20 LAB — TYPE AND SCREEN
ABO/RH(D): O POS
Antibody Screen: NEGATIVE

## 2020-02-20 LAB — SURGICAL PCR SCREEN
MRSA, PCR: NEGATIVE
Staphylococcus aureus: POSITIVE — AB

## 2020-02-20 LAB — APTT: aPTT: 36 seconds (ref 24–36)

## 2020-02-20 NOTE — Progress Notes (Signed)
PCP - Dr. Deland Pretty Cardiac Thoracic- Dr. Gilford Raid   PPM/ICD - denies  Chest x-ray - N/A EKG - 02/20/2020 Stress Test - denies ECHO - denies Cardiac Cath - denies  Sleep Study - denies  CPAP - N/A  DM: denies  Blood Thinner Instructions: N/A Aspirin Instructions: per patient, continue taking as prescribed  ERAS Protcol - No  COVID TEST- Scheduled for 02/28/2020. Patient verbalized understanding of self-quarantine instructions, appointment time and place.  Anesthesia review: COPD hx, EKG  Patient denies shortness of breath, fever, cough and chest pain at PAT appointment  All instructions explained to the patient, with a verbal understanding of the material. Patient agrees to go over the instructions while at home for a better understanding. Patient also instructed to self quarantine after being tested for COVID-19. The opportunity to ask questions was provided.

## 2020-02-21 NOTE — Anesthesia Preprocedure Evaluation (Addendum)
Anesthesia Evaluation  Patient identified by MRN, date of birth, ID band Patient awake    Reviewed: Allergy & Precautions, NPO status , Patient's Chart, lab work & pertinent test results  Airway Mallampati: I  TM Distance: >3 FB Neck ROM: Full    Dental  (+) Upper Dentures, Edentulous Lower   Pulmonary COPD,  COPD inhaler, Current SmokerPatient did not abstain from smoking.,    Pulmonary exam normal breath sounds clear to auscultation       Cardiovascular hypertension, Pt. on medications Normal cardiovascular exam Rhythm:Regular Rate:Normal  ECG: NSR, rate 67   Neuro/Psych CVA, No Residual Symptoms negative psych ROS   GI/Hepatic negative GI ROS, Neg liver ROS,   Endo/Other  negative endocrine ROS  Renal/GU negative Renal ROS     Musculoskeletal  (+) Arthritis ,   Abdominal   Peds  Hematology HLD   Anesthesia Other Findings ABDOMINAL AORTIC ANEURYSM  Reproductive/Obstetrics                           Anesthesia Physical Anesthesia Plan  ASA: III  Anesthesia Plan: General   Post-op Pain Management:    Induction: Intravenous  PONV Risk Score and Plan: 2 and Ondansetron, Dexamethasone and Treatment may vary due to age or medical condition  Airway Management Planned: Oral ETT  Additional Equipment: Arterial line  Intra-op Plan:   Post-operative Plan: Extubation in OR  Informed Consent: I have reviewed the patients History and Physical, chart, labs and discussed the procedure including the risks, benefits and alternatives for the proposed anesthesia with the patient or authorized representative who has indicated his/her understanding and acceptance.       Plan Discussed with: CRNA  Anesthesia Plan Comments: (Reviewed PAT note written by Myra Gianotti, PA-C. )      Anesthesia Quick Evaluation

## 2020-02-21 NOTE — Progress Notes (Signed)
Anesthesia Chart Review:  Case: [id number] Date/Time: 02/29/20 0934   Procedure: ABDOMINAL AORTIC ENDOVASCULAR STENT GRAFT (N/A )   Anesthesia type: General   Pre-op diagnosis: ABDOMINAL AORTIC ANEURYSM   Location: [facility] OR ROOM 16 / [place] [facility]   Surgeons: Marty Heck, MD      DISCUSSION: Patient is an 79 year old female scheduled for the above procedure.  She recently fell off of a stool and developed severe back pain.  Ultimately an MRI of the L-spine was done on 12/25/2019 per orthopedist Melina Schools, MD.  By notes, this showed an acute versus subacute severe anterior wedge fracture at L1 and an incidental finding of 1.8 cm saccular aneurysm arising from the infrarenal abdominal aorta. She was evaluated by both CT surgeon Dr. Cyndia Bent and vascular surgeon Dr. Carlis Abbott. Following 01/24/20 CT, Dr. Carlis Abbott recommended EVAR. Her known fusiform ascending TAA was felt stable in size without need for intervention currently (2 year follow-up recommended).   History includes smoking, COPD, hypercholesterolemia, HTN, TIA (> 10 years ago), cervical cancer (s/p hysterectomy 1972), ascending TAA (3.7 cm 01/24/20 CTA), AAA, cholecystectomy.  I called and spoke with patient. She denied known CAD. COPD is managed by her PCP. She is not on home O2. She uses albuterol nebulizer BID and Trelegy Ellipta daily. She denied any recent COPD exacerbations. She denied any significant SOB; however, her back pain limits her activity. She said she is able to clean her house including vacuuming and sweeping without chest pain but has to rest between tasks due to back pain and not breathing. Prior to her fall, she was able to lie flat. She denied edema and palpitations. She denied prior stress test or cardiac cath. She believes she had a prior EKG many years ago, but is unsure of where.   Patient to continue aspirin perioperatively. Preoperative COVID-19 test is scheduled for 02/28/20. Currently, she feels her respiratory status is stable.  She denied chest pain. EKG showed possible septal infarct (versus lead placement). Reviewed history with anesthesiologist Oren Bracket, MD. Anesthesia team to evaluate on the day of surgery. Case is posted for general anesthesia, although anesthesiologist to determine definitive anesthesia plan following evaluation. She did have a recent L1 compression fracture, but otherwise no reported back surgeries.    VS: BP 139/62   Pulse 74   Temp 36.6 C (Oral)   Resp 17   Ht 5' (1.524 m)   Wt 53.2 kg   SpO2 97%   BMI 22.91 kg/m    PROVIDERS: Deland Pretty, MD his PCP Gilford Raid, MD is CT surgeon - She has seen pulmonologist Gwenevere Ghazi, MD at [facility] in the past, but not recently. She says COPD is now managed by her PCP.    LABS: Labs reviewed: Acceptable for surgery. (all labs ordered are listed, but only abnormal results are displayed)  Labs Reviewed  SURGICAL PCR SCREEN - Abnormal; Notable for the following components:      Result Value   Staphylococcus aureus POSITIVE (*)    All other components within normal limits  COMPREHENSIVE METABOLIC PANEL - Abnormal; Notable for the following components:   Potassium 3.3 (*)    BUN 24 (*)    Creatinine, Ser 1.25 (*)    GFR calc non Af Amer 40 (*)    GFR calc Af Amer 47 (*)    All other components within normal limits  APTT  CBC  PROTIME-INR  URINALYSIS, ROUTINE W REFLEX MICROSCOPIC  TYPE AND SCREEN  IMAGES: CTA chest/abd/pelvis 01/24/20: IMPRESSION: - Stable mild aneurysmal dilatation of the ascending thoracic aorta, maximal diameter 3.7 cm. - No other acute intrathoracic finding - Chronic bronchial wall thickening and suspect scattered areas of retained secretions or mild mucous plugging, most pronounced in the right lower lobe. - Infrarenal abdominal aortic chronic appearing saccular aneurysm versus large chronic penetrating atherosclerotic ulcer. Abnormality measures 2 cm in maximal diameter.  Including the native aorta maximal diameter of 3 cm. - Mesenteric and renal vasculature remain patent. - No other acute intra-abdominal or pelvic vascular finding. - Acute appearing L1 compression fracture. - Chronic and postoperative findings as above.   EKG: 02/20/20:  Normal sinus rhythm Possible Left atrial enlargement Septal infarct , age undetermined Abnormal ECG No previous tracing Confirmed by Fransico Him [phone]) on 02/20/2020 2:43:27 PM   CV: Echo 11/17/07: SUMMARY  - Overall left ventricular systolic function was normal. Left     ventricular ejection fraction was estimated to be 65 %.     Doppler parameters were consistent with abnormal left     ventricular relaxation.  - Aortic valve thickness was mildly increased.  - There was mild ascending aortic dilatation.  - There was mild mitral annular calcification.    Past Medical History:  Diagnosis Date  . Arthritis   . Cancer (HCC)    CERVICAL  . COPD (chronic obstructive pulmonary disease) ([place])   . Hypercholesteremia   . Hypertension   . Hypokalemia   . Stroke [facility])    TIA  . Thoracic aortic aneurysm without rupture ([place]) 10/29/2016   PER CT CHEST  . Vitamin D deficiency     Past Surgical History:  Procedure Laterality Date  . ABDOMINAL HYSTERECTOMY     WITH BSO  . CHOLECYSTECTOMY      MEDICATIONS: . albuterol (PROVENTIL) (2.5 MG/3ML) 0.083% nebulizer solution  . aspirin 81 MG tablet  . Cholecalciferol (VITAMIN D3) 5000 units CAPS  . Multiple Vitamins-Minerals (PRESERVISION AREDS 2+MULTI VIT PO)  . Potassium 99 MG TABS  . simvastatin (ZOCOR) 40 MG tablet  . TRELEGY ELLIPTA 100-62.5-25 MCG/INH AEPB  . triamterene-hydrochlorothiazide (DYAZIDE) 37.5-25 MG capsule   No current facility-administered medications for this encounter.    Myra Gianotti, PA-C Surgical Short Stay/Anesthesiology [facility] Phone [phone] [facility] Phone [phone] 02/21/2020 5:14 PM

## 2020-02-26 DIAGNOSIS — I714 Abdominal aortic aneurysm, without rupture: Secondary | ICD-10-CM | POA: Diagnosis not present

## 2020-02-26 DIAGNOSIS — I712 Thoracic aortic aneurysm, without rupture: Secondary | ICD-10-CM | POA: Diagnosis not present

## 2020-02-26 DIAGNOSIS — N281 Cyst of kidney, acquired: Secondary | ICD-10-CM | POA: Diagnosis not present

## 2020-02-26 DIAGNOSIS — B009 Herpesviral infection, unspecified: Secondary | ICD-10-CM | POA: Diagnosis not present

## 2020-02-27 ENCOUNTER — Other Ambulatory Visit: Payer: Self-pay | Admitting: Internal Medicine

## 2020-02-27 DIAGNOSIS — N281 Cyst of kidney, acquired: Secondary | ICD-10-CM

## 2020-02-28 ENCOUNTER — Other Ambulatory Visit (HOSPITAL_COMMUNITY)
Admission: RE | Admit: 2020-02-28 | Discharge: 2020-02-28 | Disposition: A | Discharge status: Home / Self Care | Payer: Medicare HMO | Source: Ambulatory Visit | Attending: Vascular Surgery | Admitting: Vascular Surgery

## 2020-02-28 DIAGNOSIS — J449 Chronic obstructive pulmonary disease, unspecified: Secondary | ICD-10-CM | POA: Diagnosis present

## 2020-02-28 DIAGNOSIS — Z8673 Personal history of transient ischemic attack (TIA), and cerebral infarction without residual deficits: Secondary | ICD-10-CM | POA: Diagnosis not present

## 2020-02-28 DIAGNOSIS — Z01812 Encounter for preprocedural laboratory examination: Secondary | ICD-10-CM | POA: Insufficient documentation

## 2020-02-28 DIAGNOSIS — E559 Vitamin D deficiency, unspecified: Secondary | ICD-10-CM | POA: Diagnosis not present

## 2020-02-28 DIAGNOSIS — M4856XA Collapsed vertebra, not elsewhere classified, lumbar region, initial encounter for fracture: Secondary | ICD-10-CM | POA: Diagnosis not present

## 2020-02-28 DIAGNOSIS — Z7982 Long term (current) use of aspirin: Secondary | ICD-10-CM | POA: Diagnosis not present

## 2020-02-28 DIAGNOSIS — Z79899 Other long term (current) drug therapy: Secondary | ICD-10-CM | POA: Diagnosis not present

## 2020-02-28 DIAGNOSIS — F17210 Nicotine dependence, cigarettes, uncomplicated: Secondary | ICD-10-CM | POA: Diagnosis present

## 2020-02-28 DIAGNOSIS — I10 Essential (primary) hypertension: Secondary | ICD-10-CM | POA: Diagnosis present

## 2020-02-28 DIAGNOSIS — E7800 Pure hypercholesterolemia, unspecified: Secondary | ICD-10-CM | POA: Diagnosis present

## 2020-02-28 DIAGNOSIS — Z20822 Contact with and (suspected) exposure to covid-19: Secondary | ICD-10-CM | POA: Diagnosis present

## 2020-02-28 DIAGNOSIS — Z7951 Long term (current) use of inhaled steroids: Secondary | ICD-10-CM | POA: Diagnosis not present

## 2020-02-28 DIAGNOSIS — E785 Hyperlipidemia, unspecified: Secondary | ICD-10-CM | POA: Diagnosis present

## 2020-02-28 DIAGNOSIS — Z8541 Personal history of malignant neoplasm of cervix uteri: Secondary | ICD-10-CM | POA: Diagnosis not present

## 2020-02-28 DIAGNOSIS — I714 Abdominal aortic aneurysm, without rupture: Secondary | ICD-10-CM | POA: Diagnosis present

## 2020-02-28 LAB — SARS CORONAVIRUS 2 (TAT 6-24 HRS): SARS Coronavirus 2: NEGATIVE

## 2020-02-29 ENCOUNTER — Inpatient Hospital Stay (HOSPITAL_COMMUNITY): Payer: Medicare HMO

## 2020-02-29 ENCOUNTER — Other Ambulatory Visit: Payer: Self-pay

## 2020-02-29 ENCOUNTER — Inpatient Hospital Stay (HOSPITAL_COMMUNITY): Payer: Medicare HMO | Admitting: Anesthesiology

## 2020-02-29 ENCOUNTER — Encounter (HOSPITAL_COMMUNITY)
Admission: RE | Disposition: A | Discharge status: Home / Self Care | Payer: Self-pay | Source: Home / Self Care | Attending: Vascular Surgery

## 2020-02-29 ENCOUNTER — Encounter (HOSPITAL_COMMUNITY): Payer: Self-pay | Admitting: Vascular Surgery

## 2020-02-29 ENCOUNTER — Inpatient Hospital Stay (HOSPITAL_COMMUNITY): Payer: Medicare HMO | Admitting: Vascular Surgery

## 2020-02-29 ENCOUNTER — Inpatient Hospital Stay (HOSPITAL_COMMUNITY)
Admission: RE | Admit: 2020-02-29 | Discharge: 2020-03-01 | DRG: 269 | Disposition: A | Discharge status: Home Health | Payer: Medicare HMO | Attending: Vascular Surgery | Admitting: Vascular Surgery

## 2020-02-29 DIAGNOSIS — Z7951 Long term (current) use of inhaled steroids: Secondary | ICD-10-CM

## 2020-02-29 DIAGNOSIS — Z20822 Contact with and (suspected) exposure to covid-19: Secondary | ICD-10-CM | POA: Diagnosis present

## 2020-02-29 DIAGNOSIS — F17210 Nicotine dependence, cigarettes, uncomplicated: Secondary | ICD-10-CM | POA: Diagnosis present

## 2020-02-29 DIAGNOSIS — E785 Hyperlipidemia, unspecified: Secondary | ICD-10-CM | POA: Diagnosis present

## 2020-02-29 DIAGNOSIS — J449 Chronic obstructive pulmonary disease, unspecified: Secondary | ICD-10-CM | POA: Diagnosis present

## 2020-02-29 DIAGNOSIS — E7800 Pure hypercholesterolemia, unspecified: Secondary | ICD-10-CM | POA: Diagnosis present

## 2020-02-29 DIAGNOSIS — Z8679 Personal history of other diseases of the circulatory system: Secondary | ICD-10-CM

## 2020-02-29 DIAGNOSIS — Z79899 Other long term (current) drug therapy: Secondary | ICD-10-CM | POA: Diagnosis not present

## 2020-02-29 DIAGNOSIS — I10 Essential (primary) hypertension: Secondary | ICD-10-CM | POA: Diagnosis present

## 2020-02-29 DIAGNOSIS — Z8673 Personal history of transient ischemic attack (TIA), and cerebral infarction without residual deficits: Secondary | ICD-10-CM

## 2020-02-29 DIAGNOSIS — Z7982 Long term (current) use of aspirin: Secondary | ICD-10-CM

## 2020-02-29 DIAGNOSIS — Z8541 Personal history of malignant neoplasm of cervix uteri: Secondary | ICD-10-CM | POA: Diagnosis not present

## 2020-02-29 DIAGNOSIS — I714 Abdominal aortic aneurysm, without rupture, unspecified: Secondary | ICD-10-CM | POA: Diagnosis present

## 2020-02-29 HISTORY — PX: ABDOMINAL AORTIC ENDOVASCULAR STENT GRAFT: SHX5707

## 2020-02-29 LAB — CREATININE, SERUM
Creatinine, Ser: 1.14 mg/dL — ABNORMAL HIGH (ref 0.44–1.00)
GFR calc Af Amer: 52 mL/min — ABNORMAL LOW (ref >60)
GFR calc non Af Amer: 45 mL/min — ABNORMAL LOW (ref >60)

## 2020-02-29 LAB — CBC
HCT: 40.7 % (ref 36.0–46.0)
Hemoglobin: 12.7 g/dL (ref 12.0–15.0)
MCH: 29.7 pg (ref 26.0–34.0)
MCHC: 31.2 g/dL (ref 30.0–36.0)
MCV: 95.1 fL (ref 80.0–100.0)
Platelets: 209 10*3/uL (ref 150–400)
RBC: 4.28 MIL/uL (ref 3.87–5.11)
RDW: 13.2 % (ref 11.5–15.5)
WBC: 9.2 10*3/uL (ref 4.0–10.5)
nRBC: 0 % (ref 0.0–0.2)

## 2020-02-29 LAB — PROTIME-INR
INR: 1.1 (ref 0.8–1.2)
Prothrombin Time: 13.8 seconds (ref 11.4–15.2)

## 2020-02-29 LAB — MAGNESIUM: Magnesium: 2 mg/dL (ref 1.7–2.4)

## 2020-02-29 LAB — ABO/RH: ABO/RH(D): O POS

## 2020-02-29 LAB — APTT: aPTT: 37 seconds — ABNORMAL HIGH (ref 24–36)

## 2020-02-29 SURGERY — INSERTION, ENDOVASCULAR STENT GRAFT, AORTA, ABDOMINAL
Anesthesia: General | Site: Groin

## 2020-02-29 MED ORDER — FENTANYL CITRATE (PF) 250 MCG/5ML IJ SOLN
INTRAMUSCULAR | Status: DC | PRN
  Administered 2020-02-29: 100 ug via INTRAVENOUS

## 2020-02-29 MED ORDER — SODIUM CHLORIDE 0.9 % IV SOLN: INTRAVENOUS | Status: DC

## 2020-02-29 MED ORDER — ORAL CARE MOUTH RINSE: 15.0000 mL | Freq: Once | OROMUCOSAL | Status: AC

## 2020-02-29 MED ORDER — PROTAMINE SULFATE 10 MG/ML IV SOLN
INTRAVENOUS | Status: DC | PRN
Start: 2020-02-29 — End: 2020-02-29
  Administered 2020-02-29: 50 mg via INTRAVENOUS

## 2020-02-29 MED ORDER — CHLORHEXIDINE GLUCONATE 0.12 % MT SOLN
15.0000 mL | Freq: Once | OROMUCOSAL | Status: AC
  Administered 2020-02-29: 15 mL via OROMUCOSAL
  Filled 2020-02-29: qty 15

## 2020-02-29 MED ORDER — PHENYLEPHRINE HCL-NACL 10-0.9 MG/250ML-% IV SOLN
INTRAVENOUS | Status: DC | PRN
  Administered 2020-02-29: 30 ug/min via INTRAVENOUS

## 2020-02-29 MED ORDER — SUGAMMADEX SODIUM 200 MG/2ML IV SOLN
INTRAVENOUS | Status: DC | PRN
  Administered 2020-02-29: 200 mg via INTRAVENOUS

## 2020-02-29 MED ORDER — TRIAMTERENE-HCTZ 37.5-25 MG PO TABS
1.0000 | ORAL_TABLET | Freq: Every day | ORAL | Status: DC
  Administered 2020-02-29 – 2020-03-01 (×2): 1 via ORAL
  Filled 2020-02-29 (×2): qty 1

## 2020-02-29 MED ORDER — SODIUM CHLORIDE 0.9 % IV SOLN
INTRAVENOUS | Status: AC
  Filled 2020-02-29: qty 1.2

## 2020-02-29 MED ORDER — HEPARIN SODIUM (PORCINE) 5000 UNIT/ML IJ SOLN
5000.0000 [IU] | Freq: Three times a day (TID) | INTRAMUSCULAR | Status: DC
  Administered 2020-03-01: 5000 [IU] via SUBCUTANEOUS
  Filled 2020-02-29: qty 1

## 2020-02-29 MED ORDER — IODIXANOL 320 MG/ML IV SOLN
INTRAVENOUS | Status: DC | PRN
  Administered 2020-02-29: 30.4 mL

## 2020-02-29 MED ORDER — OXYCODONE-ACETAMINOPHEN 5-325 MG PO TABS
1.0000 | ORAL_TABLET | ORAL | Status: DC | PRN
  Administered 2020-02-29: 1 via ORAL
  Filled 2020-02-29: qty 1

## 2020-02-29 MED ORDER — PANTOPRAZOLE SODIUM 40 MG PO TBEC
40.0000 mg | DELAYED_RELEASE_TABLET | Freq: Every day | ORAL | Status: DC
  Administered 2020-02-29 – 2020-03-01 (×2): 40 mg via ORAL
  Filled 2020-02-29 (×2): qty 1

## 2020-02-29 MED ORDER — MAGNESIUM SULFATE 2 GM/50ML IV SOLN: 2.0000 g | Freq: Every day | INTRAVENOUS | Status: DC | PRN

## 2020-02-29 MED ORDER — PROPOFOL 10 MG/ML IV BOLUS
INTRAVENOUS | Status: AC
  Filled 2020-02-29: qty 20

## 2020-02-29 MED ORDER — ACETAMINOPHEN 500 MG PO TABS
1000.0000 mg | ORAL_TABLET | Freq: Once | ORAL | Status: AC
  Administered 2020-02-29: 1000 mg via ORAL
  Filled 2020-02-29: qty 2

## 2020-02-29 MED ORDER — ALBUTEROL SULFATE (2.5 MG/3ML) 0.083% IN NEBU
2.5000 mg | INHALATION_SOLUTION | Freq: Four times a day (QID) | RESPIRATORY_TRACT | Status: DC | PRN
  Administered 2020-03-01: 2.5 mg via RESPIRATORY_TRACT
  Filled 2020-02-29: qty 3

## 2020-02-29 MED ORDER — DEXAMETHASONE SODIUM PHOSPHATE 10 MG/ML IJ SOLN
INTRAMUSCULAR | Status: DC | PRN
  Administered 2020-02-29: 5 mg via INTRAVENOUS

## 2020-02-29 MED ORDER — LACTATED RINGERS IV SOLN
INTRAVENOUS | Status: DC | PRN
Start: 2020-02-29 — End: 2020-02-29

## 2020-02-29 MED ORDER — MORPHINE SULFATE (PF) 2 MG/ML IV SOLN: 2.0000 mg | INTRAVENOUS | Status: DC | PRN

## 2020-02-29 MED ORDER — UMECLIDINIUM BROMIDE 62.5 MCG/INH IN AEPB
1.0000 | INHALATION_SPRAY | Freq: Every day | RESPIRATORY_TRACT | Status: DC
  Filled 2020-02-29: qty 7

## 2020-02-29 MED ORDER — GUAIFENESIN-DM 100-10 MG/5ML PO SYRP: 15.0000 mL | ORAL_SOLUTION | ORAL | Status: DC | PRN

## 2020-02-29 MED ORDER — HEPARIN SODIUM (PORCINE) 1000 UNIT/ML IJ SOLN
INTRAMUSCULAR | Status: DC | PRN
Start: 2020-02-29 — End: 2020-02-29
  Administered 2020-02-29: 6000 [IU] via INTRAVENOUS

## 2020-02-29 MED ORDER — ESMOLOL HCL 100 MG/10ML IV SOLN
INTRAVENOUS | Status: DC | PRN
Start: 2020-02-29 — End: 2020-02-29
  Administered 2020-02-29: 20 mg via INTRAVENOUS
  Administered 2020-02-29: 30 mg via INTRAVENOUS

## 2020-02-29 MED ORDER — ONDANSETRON HCL 4 MG/2ML IJ SOLN: 4.0000 mg | Freq: Once | INTRAMUSCULAR | Status: DC | PRN

## 2020-02-29 MED ORDER — SODIUM CHLORIDE 0.9 % IV SOLN: 500.0000 mL | Freq: Once | INTRAVENOUS | Status: DC | PRN

## 2020-02-29 MED ORDER — FLUTICASONE-UMECLIDIN-VILANT 100-62.5-25 MCG/INH IN AEPB: 1.0000 | INHALATION_SPRAY | Freq: Every day | RESPIRATORY_TRACT | Status: DC

## 2020-02-29 MED ORDER — CEFAZOLIN SODIUM-DEXTROSE 2-4 GM/100ML-% IV SOLN
2.0000 g | Freq: Three times a day (TID) | INTRAVENOUS | Status: AC
  Administered 2020-02-29 – 2020-03-01 (×2): 2 g via INTRAVENOUS
  Filled 2020-02-29 (×2): qty 100

## 2020-02-29 MED ORDER — GLYCOPYRROLATE PF 0.2 MG/ML IJ SOSY
PREFILLED_SYRINGE | INTRAMUSCULAR | Status: AC
  Filled 2020-02-29: qty 1

## 2020-02-29 MED ORDER — PHENOL 1.4 % MT LIQD: 1.0000 | OROMUCOSAL | Status: DC | PRN

## 2020-02-29 MED ORDER — METOPROLOL TARTRATE 5 MG/5ML IV SOLN: 2.0000 mg | INTRAVENOUS | Status: DC | PRN

## 2020-02-29 MED ORDER — FENTANYL CITRATE (PF) 100 MCG/2ML IJ SOLN: 25.0000 ug | INTRAMUSCULAR | Status: DC | PRN

## 2020-02-29 MED ORDER — SODIUM CHLORIDE 0.9 % IV SOLN: INTRAVENOUS | Status: DC | PRN

## 2020-02-29 MED ORDER — LABETALOL HCL 5 MG/ML IV SOLN: 10.0000 mg | INTRAVENOUS | Status: DC | PRN

## 2020-02-29 MED ORDER — LIDOCAINE 2% (20 MG/ML) 5 ML SYRINGE
INTRAMUSCULAR | Status: DC | PRN
  Administered 2020-02-29: 60 mg via INTRAVENOUS

## 2020-02-29 MED ORDER — POTASSIUM CHLORIDE CRYS ER 20 MEQ PO TBCR: 20.0000 meq | EXTENDED_RELEASE_TABLET | Freq: Every day | ORAL | Status: DC | PRN

## 2020-02-29 MED ORDER — CEFAZOLIN SODIUM-DEXTROSE 2-4 GM/100ML-% IV SOLN
2.0000 g | INTRAVENOUS | Status: AC
  Administered 2020-02-29: 2 g via INTRAVENOUS
  Filled 2020-02-29: qty 100

## 2020-02-29 MED ORDER — TRIAMTERENE-HCTZ 37.5-25 MG PO CAPS
1.0000 | ORAL_CAPSULE | Freq: Every day | ORAL | Status: DC
  Filled 2020-02-29: qty 1

## 2020-02-29 MED ORDER — GLYCOPYRROLATE 0.2 MG/ML IJ SOLN
INTRAMUSCULAR | Status: DC | PRN
Start: 2020-02-29 — End: 2020-02-29
  Administered 2020-02-29: .2 mg via INTRAVENOUS

## 2020-02-29 MED ORDER — PROPOFOL 10 MG/ML IV BOLUS
INTRAVENOUS | Status: DC | PRN
  Administered 2020-02-29: 150 mg via INTRAVENOUS

## 2020-02-29 MED ORDER — CHLORHEXIDINE GLUCONATE CLOTH 2 % EX PADS: 6.0000 | MEDICATED_PAD | Freq: Once | CUTANEOUS | Status: DC

## 2020-02-29 MED ORDER — PROTAMINE SULFATE 10 MG/ML IV SOLN
INTRAVENOUS | Status: AC
  Filled 2020-02-29: qty 5

## 2020-02-29 MED ORDER — FLUTICASONE FUROATE-VILANTEROL 100-25 MCG/INH IN AEPB
1.0000 | INHALATION_SPRAY | Freq: Every day | RESPIRATORY_TRACT | Status: DC
  Filled 2020-02-29: qty 28

## 2020-02-29 MED ORDER — DOCUSATE SODIUM 100 MG PO CAPS
100.0000 mg | ORAL_CAPSULE | Freq: Every day | ORAL | Status: DC
  Filled 2020-02-29: qty 1

## 2020-02-29 MED ORDER — ALUM & MAG HYDROXIDE-SIMETH 200-200-20 MG/5ML PO SUSP: 15.0000 mL | ORAL | Status: DC | PRN

## 2020-02-29 MED ORDER — ACETAMINOPHEN 325 MG PO TABS: 325.0000 mg | ORAL_TABLET | ORAL | Status: DC | PRN

## 2020-02-29 MED ORDER — ROCURONIUM BROMIDE 10 MG/ML (PF) SYRINGE
PREFILLED_SYRINGE | INTRAVENOUS | Status: DC | PRN
  Administered 2020-02-29: 40 mg via INTRAVENOUS

## 2020-02-29 MED ORDER — LACTATED RINGERS IV SOLN: INTRAVENOUS | Status: DC

## 2020-02-29 MED ORDER — SIMVASTATIN 20 MG PO TABS
40.0000 mg | ORAL_TABLET | Freq: Every evening | ORAL | Status: DC
  Administered 2020-02-29: 40 mg via ORAL
  Filled 2020-02-29: qty 2

## 2020-02-29 MED ORDER — ASPIRIN EC 81 MG PO TBEC
81.0000 mg | DELAYED_RELEASE_TABLET | Freq: Every day | ORAL | Status: DC
  Administered 2020-02-29 – 2020-03-01 (×2): 81 mg via ORAL
  Filled 2020-02-29 (×2): qty 1

## 2020-02-29 MED ORDER — NITROGLYCERIN 0.2 MG/ML ON CALL CATH LAB
INTRAVENOUS | Status: DC | PRN
Start: 2020-02-29 — End: 2020-02-29
  Administered 2020-02-29 (×2): 40 ug via INTRAVENOUS

## 2020-02-29 MED ORDER — ACETAMINOPHEN 10 MG/ML IV SOLN
1000.0000 mg | Freq: Once | INTRAVENOUS | Status: AC
  Administered 2020-02-29: 1000 mg via INTRAVENOUS

## 2020-02-29 MED ORDER — FENTANYL CITRATE (PF) 250 MCG/5ML IJ SOLN
INTRAMUSCULAR | Status: AC
  Filled 2020-02-29: qty 5

## 2020-02-29 MED ORDER — ONDANSETRON HCL 4 MG/2ML IJ SOLN: 4.0000 mg | Freq: Four times a day (QID) | INTRAMUSCULAR | Status: DC | PRN

## 2020-02-29 MED ORDER — HYDRALAZINE HCL 20 MG/ML IJ SOLN: 5.0000 mg | INTRAMUSCULAR | Status: DC | PRN

## 2020-02-29 MED ORDER — ONDANSETRON HCL 4 MG/2ML IJ SOLN
INTRAMUSCULAR | Status: DC | PRN
  Administered 2020-02-29: 4 mg via INTRAVENOUS

## 2020-02-29 MED ORDER — ACETAMINOPHEN 650 MG RE SUPP: 325.0000 mg | RECTAL | Status: DC | PRN

## 2020-02-29 SURGICAL SUPPLY — 50 items
CANISTER SUCT 3000ML PPV (MISCELLANEOUS) ×3 IMPLANT
CATH BEACON 5.038 65CM KMP-01 (CATHETERS) IMPLANT
CATH OMNI FLUSH .035X70CM (CATHETERS) ×3 IMPLANT
COVER WAND RF STERILE (DRAPES) ×3 IMPLANT
DERMABOND ADVANCED (GAUZE/BANDAGES/DRESSINGS) ×2
DERMABOND ADVANCED .7 DNX12 (GAUZE/BANDAGES/DRESSINGS) ×1 IMPLANT
DEVICE CLOSURE PERCLS PRGLD 6F (VASCULAR PRODUCTS) IMPLANT
DEVICE TORQUE KENDALL .025-038 (MISCELLANEOUS) ×3 IMPLANT
DRSG TEGADERM 2-3/8X2-3/4 SM (GAUZE/BANDAGES/DRESSINGS) IMPLANT
DRYSEAL FLEXSHEATH 16FR 33CM (SHEATH) ×2
ELECT REM PT RETURN 9FT ADLT (ELECTROSURGICAL) ×6
ELECTRODE REM PT RTRN 9FT ADLT (ELECTROSURGICAL) ×2 IMPLANT
EXCLDR EXT ENDO 20X4.5 15F (Endovascular Graft) ×6 IMPLANT
EXCLUDER EXT ENDO 20X4.5 15F (Endovascular Graft) ×2 IMPLANT
GAUZE SPONGE 2X2 8PLY STRL LF (GAUZE/BANDAGES/DRESSINGS) ×1 IMPLANT
GLOVE BIO SURGEON STRL SZ7.5 (GLOVE) ×3 IMPLANT
GLOVE BIOGEL PI IND STRL 8 (GLOVE) ×1 IMPLANT
GLOVE BIOGEL PI INDICATOR 8 (GLOVE) ×2
GOWN STRL REUS W/ TWL LRG LVL3 (GOWN DISPOSABLE) ×3 IMPLANT
GOWN STRL REUS W/ TWL XL LVL3 (GOWN DISPOSABLE) ×1 IMPLANT
GOWN STRL REUS W/TWL LRG LVL3 (GOWN DISPOSABLE) ×9
GOWN STRL REUS W/TWL XL LVL3 (GOWN DISPOSABLE) ×3
GRAFT BALLN CATH 65CM (STENTS) ×1 IMPLANT
GUIDEWIRE ANGLED .035X150CM (WIRE) ×3 IMPLANT
KIT BASIN OR (CUSTOM PROCEDURE TRAY) ×3 IMPLANT
KIT TURNOVER KIT B (KITS) ×3 IMPLANT
NS IRRIG 1000ML POUR BTL (IV SOLUTION) ×3 IMPLANT
PACK ENDOVASCULAR (PACKS) ×3 IMPLANT
PAD ARMBOARD 7.5X6 YLW CONV (MISCELLANEOUS) ×6 IMPLANT
PERCLOSE PROGLIDE 6F (VASCULAR PRODUCTS)
SET MICROPUNCTURE 5F STIFF (MISCELLANEOUS) ×3 IMPLANT
SHEATH BRITE TIP 8FR 23CM (SHEATH) IMPLANT
SHEATH DRYSEAL FLEX 16FR 33CM (SHEATH) ×1 IMPLANT
SHEATH PINNACLE 8F 10CM (SHEATH) IMPLANT
SPONGE GAUZE 2X2 STER 10/PKG (GAUZE/BANDAGES/DRESSINGS) ×2
STENT GRAFT BALLN CATH 65CM (STENTS) ×3
STOPCOCK MORSE 400PSI 3WAY (MISCELLANEOUS) ×3 IMPLANT
SUT ETHILON 3 0 PS 1 (SUTURE) IMPLANT
SUT MNCRL AB 4-0 PS2 18 (SUTURE) ×9 IMPLANT
SUT PROLENE 5 0 C 1 24 (SUTURE) IMPLANT
SUT VIC AB 2-0 CTX 36 (SUTURE) IMPLANT
SUT VIC AB 3-0 SH 27 (SUTURE)
SUT VIC AB 3-0 SH 27X BRD (SUTURE) IMPLANT
SYR 20ML LL LF (SYRINGE) ×3 IMPLANT
TOWEL GREEN STERILE (TOWEL DISPOSABLE) ×3 IMPLANT
TRAY FOLEY MTR SLVR 16FR STAT (SET/KITS/TRAYS/PACK) ×3 IMPLANT
TUBING HIGH PRESSURE 120CM (CONNECTOR) ×3 IMPLANT
WIRE AMPLATZ SS-J .035X180CM (WIRE) ×3 IMPLANT
WIRE BENTSON .035X145CM (WIRE) IMPLANT
WIRE TORQFLEX AUST .018X40CM (WIRE) ×3 IMPLANT

## 2020-02-29 NOTE — Transfer of Care (Signed)
Immediate Anesthesia Transfer of Care Note  Patient: Chelsey Young  Procedure(s) Performed: ABDOMINAL AORTIC ENDOVASCULAR STENT GRAFT (N/A Groin)  Patient Location: PACU  Anesthesia Type:General  Level of Consciousness: drowsy, patient cooperative and responds to stimulation  Airway & Oxygen Therapy: Patient Spontanous Breathing and Patient connected to face mask oxygen  Post-op Assessment: Report given to RN and Post -op Vital signs reviewed and stable  Post vital signs: Reviewed and stable  Last Vitals:  Vitals Value Taken Time  BP 155/78 02/29/20 1300  Temp 36.3 C 02/29/20 1300  Pulse 79 02/29/20 1304  Resp 19 02/29/20 1304  SpO2 96 % 02/29/20 1304  Vitals shown include unvalidated device data.  Last Pain:  Vitals:   02/29/20 1300  TempSrc:   PainSc: 0-No pain         Complications: No complications documented.

## 2020-02-29 NOTE — Anesthesia Procedure Notes (Signed)
Procedure Name: Intubation Date/Time: 02/29/2020 11:28 AM Performed by: Gaylene Brooks, CRNA Pre-anesthesia Checklist: Emergency Drugs available, Suction available, Patient being monitored and Timeout performed Patient Re-evaluated:Patient Re-evaluated prior to induction Oxygen Delivery Method: Circle system utilized Preoxygenation: Pre-oxygenation with 100% oxygen Induction Type: IV induction Ventilation: Mask ventilation without difficulty and Oral airway inserted - appropriate to patient size Laryngoscope Size: Miller and 2 Grade View: Grade I Tube size: 7.0 mm Number of attempts: 1 Airway Equipment and Method: Stylet Placement Confirmation: positive ETCO2,  CO2 detector and breath sounds checked- equal and bilateral Secured at: 21 cm Tube secured with: Tape Dental Injury: Teeth and Oropharynx as per pre-operative assessment  Comments: Performed by Loleta Books, SRNA

## 2020-02-29 NOTE — Anesthesia Postprocedure Evaluation (Signed)
Anesthesia Post Note  Patient: Chelsey Young  Procedure(s) Performed: ABDOMINAL AORTIC ENDOVASCULAR STENT GRAFT (N/A Groin)     Patient location during evaluation: PACU Anesthesia Type: General Level of consciousness: awake Pain management: pain level controlled Vital Signs Assessment: post-procedure vital signs reviewed and stable Respiratory status: spontaneous breathing, nonlabored ventilation, respiratory function stable and patient connected to nasal cannula oxygen Cardiovascular status: blood pressure returned to baseline and stable Postop Assessment: no apparent nausea or vomiting Anesthetic complications: no   No complications documented.  Last Vitals:  Vitals:   02/29/20 1530 02/29/20 1600  BP: (!) 143/61 (!) 127/55  Pulse: (!) 52 65  Resp: 17 (!) 22  Temp:    SpO2: 98% 94%    Last Pain:  Vitals:   02/29/20 1600  TempSrc:   PainSc: Asleep                  P 

## 2020-02-29 NOTE — Anesthesia Procedure Notes (Signed)
Arterial Line Insertion Start/End8/06/2020 8:50 AM Performed by: Candis Shine, CRNA, CRNA  Patient location: Pre-op. Preanesthetic checklist: patient identified, IV checked, site marked, risks and benefits discussed, surgical consent, monitors and equipment checked, pre-op evaluation, timeout performed and anesthesia consent Lidocaine 1% used for infiltration Left, radial was placed Catheter size: 20 G Hand hygiene performed  and maximum sterile barriers used   Attempts: 1 Procedure performed without using ultrasound guided technique. Following insertion, dressing applied and Biopatch. Post procedure assessment: normal and unchanged  Patient tolerated the procedure well with no immediate complications.

## 2020-02-29 NOTE — Progress Notes (Signed)
  Progress Note    02/29/2020 3:11 PM Day of Surgery  Subjective:  No major complaints. Some back pain due to positioning   Vitals:   02/29/20 1430 02/29/20 1500  BP: (!) 123/58 (!) 131/59  Pulse: (!) 59 62  Resp: 13 13  Temp:    SpO2: 92% 98%   Physical Exam: Cardiac:  regular Lungs: non labored Incisions:  Left femoral access site clean, dry and intact without swelling or hematoma Extremities:  Bilateral lower extremities well perfused and warm. Palpable DP bilaterally Abdomen:  Soft, non tender, non distended Neurologic: alert and oriented  CBC    Component Value Date/Time   WBC 9.2 02/29/2020 1322   RBC 4.28 02/29/2020 1322   HGB 12.7 02/29/2020 1322   HCT 40.7 02/29/2020 1322   PLT 209 02/29/2020 1322   MCV 95.1 02/29/2020 1322   MCH 29.7 02/29/2020 1322   MCHC 31.2 02/29/2020 1322   RDW 13.2 02/29/2020 1322    BMET    Component Value Date/Time   NA 139 02/20/2020 1220   K 3.3 (L) 02/20/2020 1220   CL 102 02/20/2020 1220   CO2 28 02/20/2020 1220   GLUCOSE 86 02/20/2020 1220   BUN 24 (H) 02/20/2020 1220   CREATININE 1.14 (H) 02/29/2020 1322   CALCIUM 9.7 02/20/2020 1220   GFRNONAA 45 (L) 02/29/2020 1322   GFRAA 52 (L) 02/29/2020 1322    INR    Component Value Date/Time   INR 1.1 02/29/2020 1322     Intake/Output Summary (Last 24 hours) at 02/29/2020 1511 Last data filed at 02/29/2020 1302 Gross per 24 hour  Intake 1300 ml  Output 480 ml  Net 820 ml     Assessment/Plan:  79 y.o. female is s/p 1.  Ultrasound-guided percutaneous access of left common femoral artery for delivery of endograft with percutaneous closure 2.  Endovascular repair of infrarenal aortic saccular aneurysm with tube endograft (20 mm x 4.5 cm aortic cuff x2, GORE) 3.  Aortogram with retrograde left iliac arteriogram  Day of Surgery. Bilateral lower extremities well perfused and warm. Palpable DP pulses bilaterally. Left femoral access site clean, dry and intact without  swelling or hematoma. Pain well controlled. If she does well overnight anticipate discharge tomorrow. To 4E later today   Karoline Caldwell, PA-C [facility] [phone] 02/29/2020 3:11 PM

## 2020-02-29 NOTE — Op Note (Signed)
Date: February 29, 2020  Preoperative diagnosis: Saccular aneurysm of infrarenal abdominal aorta  Postoperative diagnosis: Same  Procedure: 1.  Ultrasound-guided percutaneous access of left common femoral artery for delivery of endograft with percutaneous closure 2.  Endovascular repair of infrarenal aortic saccular aneurysm with tube endograft (20 mm x 4.5 cm aortic cuff x2, GORE) 3.  Aortogram with retrograde left iliac arteriogram  Surgeon: Dr. Marty Heck, MD  Assistant: Paulo Fruit, PA  Indication: Patient is an 79 year old female who was seen in clinic with evidence of a saccular infra-renal abdominal aortic aneurysm.  This was discovered incidentally after she had sustained a back fracture and an MRI was ordered of her lumbar spine by orthopedic surgery.  She presents today for planned endovascular repair through transfemoral access after risk benefits discussed.  An assistant was needed to expedite the case and for sheath exchanges.  Findings: Ultrasound-guided access of the left common femoral artery for placement of a 16 French Gore dry seal sheath with Perclose.  Ultimately the infrarenal aortic saccular aneurysm was treated with two 20 mm x 4.5 cm aortic cuffs.  MOB balloon was used to treat overlapping segments as well as the proximal and distal zone.  Initial aortogram after deployment showed a type III endoleak from overlapping components.  I subsequently treated the overlapping segments of stent a second time with a MOB balloon and on final aortogram there is no evidence of endoleak.  She had a palpable left dorsalis pedis pulse at completion of the case after the sheath was removed and the Perclose devices were secured in the left common femoral artery.  Anesthesia: General  Details: Patient was taken to the operating room after informed consent was obtained.  Placed on operative table in supine position and general endotracheal anesthesia was induced.  Her bilateral  groins and abdomen were then prepped and draped in usual sterile fashion.  She did get preoperative antibiotics.  Timeout was performed to identify patient, procedure and site.  Initially ultrasound was used to evaluate the left common femoral artery which was patent and image was saved.  This was then accessed on the anterior wall with a micro access needle in a healthy segment of the artery.  I then placed a microwire and then made a stab incision around the microwire and dissected down with a hemostat to create a subcutaneous track.  Microsheath was placed in the left common femoral artery and then we placed a Bentson wire into the infrarenal aorta.  We then dilated the track in the common femoral artery with a 8 French dilator and Perclose device was deployed at 10:00 and 2:00 in the left common femoral artery under fluoroscopic guidance.  Ultimately that point in time we then used a marker Omni Flush catheter to exchange for a Glidewire advantage for more support.  We then placed a 16 French Gore dry seal sheath in the left common femoral artery up to the aortic bifurcation.  Patient was given 100 units per kilogram heparin.  I then placed an Omni Flush catheter back up and aortogram was obtained to identify the right renal as well as the left renal accessory left renal as well as the aortic bifurcation and saccular aneurysm of infrarenal aorta.  Once this was marked we then deployed two cuffs initially the more distal cuff was deployed just above the aortic bifurcation and then a second cuff was deployed with adequate overlap to appropriately treat the pseudoaneurysm.  We then used a MOB balloon in  the proximal and distal seal zones and overlapping components were then ballooned.  We put a catheter back up and angiogram showed what looked like a possible type III endoleak from overlapping components with filling of the pseudoaneurysm in the infrarenal aorta.  I then put a MOB balloon backup and was more  aggressive about ballooning the overlap between the 2 aortic cuffs.  We then obtained 1 final aortogram that showed no evidence of endoleak and exclusion of the saccular aneurysm.  That point time we put a Bentson wire back up and all catheters were removed.  I did obtain a retrograde left iliac shot to ensure that there was no evidence of dissection.  While holding manual pressure in the left groin the sheath was removed over the wire and we tied down the Perclose devices at 10:00 and 2:00 with good hemostasis.  That point time the wire was removed and we finished securing the Perclose devices.  We checked the left foot and had a palpable pulse protamine was given for reversal.  4-0 Monocryl pursestring was tied down in the left groin with Dermabond and she was awakened from PACU and taken to recovery in stable condition.  Complications: None  Condition: Stable  Marty Heck, MD [facility] [place] Office: [place]

## 2020-02-29 NOTE — H&P (Signed)
History and Physical Interval Note:  02/29/2020 11:11 AM  Chelsey Young  has presented today for surgery, with the diagnosis of ABDOMINAL AORTIC ANEURYSM.  The various methods of treatment have been discussed with the patient and family. After consideration of risks, benefits and other options for treatment, the patient has consented to  Procedure(s): ABDOMINAL AORTIC ENDOVASCULAR STENT GRAFT (N/A) as a surgical intervention.  The patient's history has been reviewed, patient examined, no change in status, stable for surgery.  I have reviewed the patient's chart and labs.  Questions were answered to the patient's satisfaction.    EVAR for saccular infra-renal AAA  Marty Heck  Patient name: Chelsey Young   MRN: [id number]        DOB: Mar 16, 1941          Sex: female  REASON FOR CONSULT: Follow-up after CTA to evaluate saccular abdominal aortic aneurysm  HPI: Chelsey Young is a 79 y.o. female, with history of hypertension, hyperlipidemia, tobacco abuse, COPD, cervical cancer, ascending thoracic aneurysm that presents for follow-up after CTA to discuss suspected saccular abdominal aortic aneurysm.    As previously noted, patient was evaluated by [facility] for back fracture.  She reported that she fell off a stool and had severe back pain after this.  Ultimately an MRI was ordered of her lumbar spine on 12/25/2019 by Dr. Rolena Infante.  This ultimately showed a saccular aneurysm arising from the infrarenal abdominal aorta measuring 1.8 cm.  She denies any previous history of aneurysmal disease or knowledge of this.  She still smoking about 1/2 pack a day.  She does live independently.  Other findings on her MRI included a acute subacute severe anterior wedge fracture at L1.  She reports no new issues since follow-up other than she states Dr. Rolena Infante told her her back fracture completely collapsed.  She continues to take aspirin daily.  Still smoking.  No worsening of her abdominal  pain or back pain and at baseline.      Past Medical History:  Diagnosis Date  . Cancer (HCC)    CERVICAL  . COPD (chronic obstructive pulmonary disease) ([place])   . Hypercholesteremia   . Hypertension   . Hypokalemia   . Stroke [facility])    TIA  . Thoracic aortic aneurysm without rupture ([place]) 10/29/2016   PER CT CHEST  . Vitamin D deficiency     Past Surgical History:  Procedure Laterality Date  . ABDOMINAL HYSTERECTOMY     WITH BSO         Family History  Problem Relation Age of Onset  . Myasthenia gravis Daughter   . Epilepsy Son     SOCIAL HISTORY: Social History        Socioeconomic History  . Marital status: Widowed    Spouse name: Not on file  . Number of children: Not on file  . Years of education: Not on file  . Highest education level: Not on file  Occupational History  . Not on file  Tobacco Use  . Smoking status: Current Every Day Smoker    Packs/day: 1.00    Years: 60.00    Pack years: 60.00    Types: Cigarettes  . Smokeless tobacco: Never Used  . Tobacco comment: 2-3 CIGARETTES  A  DAY  Substance and Sexual Activity  . Alcohol use: No  . Drug use: No  . Sexual activity: Not on file  Other Topics Concern  . Not on file  Social History Narrative  .  Not on file   Social Determinants of Health      Financial Resource Strain:   . Difficulty of Paying Living Expenses:   Food Insecurity:   . Worried About Charity fundraiser in the Last Year:   . Arboriculturist in the Last Year:   Transportation Needs:   . Film/video editor (Medical):   Marland Kitchen Lack of Transportation (Non-Medical):   Physical Activity:   . Days of Exercise per Week:   . Minutes of Exercise per Session:   Stress:   . Feeling of Stress :   Social Connections:   . Frequency of Communication with Friends and Family:   . Frequency of Social Gatherings with Friends and Family:   . Attends Religious Services:   . Active Member of Clubs or  Organizations:   . Attends Archivist Meetings:   Marland Kitchen Marital Status:   Intimate Partner Violence:   . Fear of Current or Ex-Partner:   . Emotionally Abused:   Marland Kitchen Physically Abused:   . Sexually Abused:     No Known Allergies        Current Outpatient Medications  Medication Sig Dispense Refill  . albuterol (PROVENTIL) (2.5 MG/3ML) 0.083% nebulizer solution Take 2.5 mg by nebulization every 6 (six) hours as needed for wheezing or shortness of breath.    Marland Kitchen aspirin 81 MG tablet Take 81 mg by mouth daily.    . Cholecalciferol (VITAMIN D3) 5000 units CAPS Take 1 capsule by mouth daily.    . fluticasone furoate-vilanterol (BREO ELLIPTA) 100-25 MCG/INH AEPB Inhale 1 puff into the lungs daily.    . simvastatin (ZOCOR) 40 MG tablet Take 40 mg by mouth at bedtime as needed.    . triamterene-hydrochlorothiazide (DYAZIDE) 37.5-25 MG capsule Take 1 capsule by mouth daily.     No current facility-administered medications for this visit.    REVIEW OF SYSTEMS:  [X]  denotes positive finding, [ ]  denotes negative finding Cardiac  Comments:  Chest pain or chest pressure:    Shortness of breath upon exertion:    Short of breath when lying flat:    Irregular heart rhythm:        Vascular    Pain in calf, thigh, or hip brought on by ambulation:    Pain in feet at night that wakes you up from your sleep:     Blood clot in your veins:    Leg swelling:         Pulmonary    Oxygen at home:    Productive cough:     Wheezing:         Neurologic    Sudden weakness in arms or legs:     Sudden numbness in arms or legs:     Sudden onset of difficulty speaking or slurred speech:    Temporary loss of vision in one eye:     Problems with dizziness:         Gastrointestinal    Blood in stool:     Vomited blood:         Genitourinary    Burning when urinating:     Blood in urine:        Psychiatric     Major depression:         Hematologic    Bleeding problems:    Problems with blood clotting too easily:        Skin    Rashes or ulcers:  Constitutional    Fever or chills:      PHYSICAL EXAM:    Vitals:   02/13/20 0845  BP: (!) 144/69  Pulse: 60  Resp: 14  Temp: (!) 97.5 F (36.4 C)  TempSrc: Temporal  SpO2: 99%  Weight: 116 lb (52.6 kg)  Height: 5' (1.524 m)    GENERAL: The patient is a well-nourished female, in no acute distress. The vital signs are documented above. CARDIAC: There is a regular rate and rhythm.  VASCULAR:  Palpable femoral pulses bilaterally Palpable dorsalis pedis pulses bilaterally PULMONARY: There is Young air exchange bilaterally without wheezing or rales. ABDOMEN: Soft and non-tender with normal pitched bowel sounds.    DATA:    CTA abdomen pelvis from 01/24/2020 there is a saccular aneurysm arising from the infrarenal aorta.  Assessment/Plan:  79 year old female presents for follow-up to discuss CTA abdomen pelvis after incidentally discovered saccular aneurysm arising from infrarenal abdominal aorta on MRI.  After review of CTA I have recommended repair with endovascular stent graft.  Likely use one or two aortic cuffs with transfermoral access.  We discussed risks and benefits including risk of groin complication including bleeding, vessel injury, risk of anesthesia, risk of MI etc.  She wishes to proceed.  We will get her scheduled was soon as we have availability.   Marty Heck, MD [facility] [place] Office: [phone]

## 2020-02-29 NOTE — Discharge Instructions (Signed)
  [facility] [place]   Discharge Instructions  Endovascular Aortic Aneurysm Repair  Please refer to the following instructions for your post-procedure care. Your surgeon or Physician Assistant will discuss any changes with you.  Activity  You are encouraged to walk as much as you can. You can slowly return to normal activities but must avoid strenuous activity and heavy lifting until your doctor tells you it's OK. Avoid activities such as vacuuming or swinging a gold club. It is normal to feel tired for several weeks after your surgery. Do not drive until your doctor gives the OK and you are no longer taking prescription pain medications. It is also normal to have difficulty with sleep habits, eating, and bowel movements after surgery. These will go away with time.  Bathing/Showering  Shower daily after you go home.  Do not soak in a bathtub, hot tub, or swim until the incision heals completely.  If you have incisions in your groin, wash the groin wounds with soap and water daily and pat dry. (No tub bath-only shower)  Then put a dry gauze or washcloth there to keep this area dry to help prevent wound infection daily and as needed.  Do not use Vaseline or neosporin on your incisions.  Only use soap and water on your incisions and then protect and keep dry.  Incision Care  Shower every day. Clean your incision with mild soap and water. Pat the area dry with a clean towel. You do not need a bandage unless otherwise instructed. Do not apply any ointments or creams to your incision. If you clothing is irritating, you may cover your incision with a dry gauze pad.  Diet  Resume your normal diet. There are no special food restrictions following this procedure. A low fat/low cholesterol diet is recommended for all patients with vascular disease. In order to heal from your surgery, it is CRITICAL to get adequate nutrition. Your body requires vitamins, minerals, and protein.  Vegetables are the best source of vitamins and minerals. Vegetables also provide the perfect balance of protein. Processed food has little nutritional value, so try to avoid this.  Medications  Resume taking all of your medications unless your doctor or nurse practitioner tells you not to. If your incision is causing pain, you may take over-the-counter pain relievers such as acetaminophen (Tylenol). If you were prescribed a stronger pain medication, please be aware these medications can cause nausea and constipation. Prevent nausea by taking the medication with a snack or meal. Avoid constipation by drinking plenty of fluids and eating foods with a high amount of fiber, such as fruits, vegetables, and grains.  Do not take Tylenol if you are taking prescription pain medications.   Follow up  Our office will schedule a follow-up appointment with a CT scan 3-4 weeks after your surgery.  Please call us immediately for any of the following conditions  Severe or worsening pain in your legs or feet or in your abdomen back or chest. Increased pain, redness, drainage (pus) from your incision site. Increased abdominal pain, bloating, nausea, vomiting or persistent diarrhea. Fever of 101 degrees or higher. Swelling in your leg (s),  Reduce your risk of vascular disease  Stop smoking. If you would like help call [facility] at 1-800-QUIT-NOW (1-800-784-8669) or [place] at 336-586-4000. Manage your cholesterol Maintain a desired weight Control your diabetes Keep your blood pressure down  If you have questions, please call the office at [phone].  

## 2020-03-01 ENCOUNTER — Encounter (HOSPITAL_COMMUNITY): Payer: Self-pay | Admitting: Vascular Surgery

## 2020-03-01 LAB — BASIC METABOLIC PANEL
Anion gap: 11 (ref 5–15)
Anion gap: 12 (ref 5–15)
BUN: 23 mg/dL (ref 8–23)
BUN: 19 mg/dL (ref 8–23)
CO2: 26 mmol/L (ref 22–32)
CO2: 25 mmol/L (ref 22–32)
Calcium: 8.9 mg/dL (ref 8.9–10.3)
Calcium: 9.1 mg/dL (ref 8.9–10.3)
Chloride: 102 mmol/L (ref 98–111)
Chloride: 103 mmol/L (ref 98–111)
Creatinine, Ser: 1.51 mg/dL — ABNORMAL HIGH (ref 0.44–1.00)
Creatinine, Ser: 1.43 mg/dL — ABNORMAL HIGH (ref 0.44–1.00)
GFR calc Af Amer: 37 mL/min — ABNORMAL LOW (ref >60)
GFR calc Af Amer: 40 mL/min — ABNORMAL LOW (ref >60)
GFR calc non Af Amer: 32 mL/min — ABNORMAL LOW (ref >60)
GFR calc non Af Amer: 34 mL/min — ABNORMAL LOW (ref >60)
Glucose, Bld: 142 mg/dL — ABNORMAL HIGH (ref 70–99)
Glucose, Bld: 164 mg/dL — ABNORMAL HIGH (ref 70–99)
Potassium: 4.1 mmol/L (ref 3.5–5.1)
Potassium: 3.6 mmol/L (ref 3.5–5.1)
Sodium: 139 mmol/L (ref 135–145)
Sodium: 140 mmol/L (ref 135–145)

## 2020-03-01 LAB — CBC
HCT: 38.3 % (ref 36.0–46.0)
Hemoglobin: 12.2 g/dL (ref 12.0–15.0)
MCH: 30 pg (ref 26.0–34.0)
MCHC: 31.9 g/dL (ref 30.0–36.0)
MCV: 94.3 fL (ref 80.0–100.0)
Platelets: 246 10*3/uL (ref 150–400)
RBC: 4.06 MIL/uL (ref 3.87–5.11)
RDW: 13.2 % (ref 11.5–15.5)
WBC: 13.2 10*3/uL — ABNORMAL HIGH (ref 4.0–10.5)
nRBC: 0 % (ref 0.0–0.2)

## 2020-03-01 MED ORDER — OXYCODONE-ACETAMINOPHEN 5-325 MG PO TABS: 1.0000 | ORAL_TABLET | Freq: Four times a day (QID) | ORAL | 0 refills | Status: DC | PRN

## 2020-03-01 NOTE — Progress Notes (Signed)
SATURATION QUALIFICATIONS: (This note is used to comply with regulatory documentation for home oxygen)  Patient Saturations on Room Air at Rest = 89-90%  Patient Saturations on Room Air while Ambulating = 90-91%  Patient Saturations on 0 Liters of oxygen while Ambulating = %  Please briefly explain why patient needs home oxygen: Pt does not need home O2 anymore.

## 2020-03-01 NOTE — Progress Notes (Addendum)
[facility] [place]  Subjective  - Doing well without new complaints other than soreness.   Objective 98/70 68 98.2 F (36.8 C) (Oral) 17 91%  Intake/Output Summary (Last 24 hours) at 03/01/2020 2263 Last data filed at 03/01/2020 3354 Gross per 24 hour  Intake 2700 ml  Output 1530 ml  Net 1170 ml    B LE warm and well perfused with palpable DP Left groin soft without hematoma Abdomin soft Lungs non labored  Assessment/Planning: POD # 1 EVAR with left groin access  Pedal pulses palpable Abdomin soft, lumbar pain secondary to fracture, baseline.  Pending ambulation in the halls plan for discharge home today.   Roxy Horseman 03/01/2020 7:21 AM --  Laboratory Lab Results: Recent Labs    02/29/20 1322 03/01/20 0342  WBC 9.2 13.2*  HGB 12.7 12.2  HCT 40.7 38.3  PLT 209 246   BMET Recent Labs    02/29/20 1322 03/01/20 0342  NA  --  139  K  --  4.1  CL  --  102  CO2  --  26  GLUCOSE  --  142*  BUN  --  23  CREATININE 1.14* 1.51*  CALCIUM  --  8.9    COAG Lab Results  Component Value Date   INR 1.1 02/29/2020   INR 1.0 02/20/2020   No results found for: PTT  I have seen and evaluated the patient. I agree with the PA note as documented above.  Postop day 1 status post EVAR for infrarenal abdominal aortic saccular aneurysm.  Left groin looks good.  Palpable left pedal pulse.  Hemodynamics have been stable overnight.  Hemoglobin stable.  She did have a slight increase in creatinine from 1.2 to 1.5.  We will hydrate this morning and get another BMP this afternoon and if stable plan for discharge.  Follow-up in 1 month with CTA abdomen pelvis.  Discharge on aspirin statin.  Marty Heck, MD [facility] [place] Office: [phone]

## 2020-03-01 NOTE — Progress Notes (Signed)
Pt's oxygen saturations 83% on room air at rest. Pt placed back on 2L O2 and saturations improved to 94%.

## 2020-03-01 NOTE — Progress Notes (Signed)
Pt discharged home with daughter. IVs and telemetry box removed. Pt and pt's daughter received discharge instructions and all questions were answered. Pt left with all of her belongings. Pt discharged via wheelchair and was accompanied by a Wilfrid Lund and pt's daughter.

## 2020-03-01 NOTE — TOC Transition Note (Addendum)
Transition of Care (TOC) - CM/SW Discharge Note Marvetta Gibbons RN, BSN Transitions of Care Unit 4E- RN Case Manager See Treatment Team for direct phone #    Patient Details  Name: JYLLIAN HAYNIE MRN: [id number] Date of Birth: 12/08/1940  Transition of Care [facility]) CM/SW Contact:  Dawayne Patricia, RN Phone Number: 03/01/2020, 1:18 PM   Clinical Narrative:    Pt stable for transition home today s/p AAA- pt was noted to possibly need home 02 earlier this am, on f/u check pt did not qualify for home 02, and home 02 orders canceled. Per bedside RN pt also was declining home 02 stating she did not want it. Pt to return home with son. Per pt she has nebulizer at home already.   Final next level of care: [place] Barriers to Discharge: No Barriers Identified   Patient Goals and CMS Choice Patient states their goals for this hospitalization and ongoing recovery are:: return home      Discharge Placement                 Home       Discharge Plan and Services                 Spoke with Zach with [employer] for home 02 needs- orders were placed however pt declined and also did not qualify for home 02- Zach was updated and referral canceled.              Social Determinants of Health (SDOH) Interventions     Readmission Risk Interventions Readmission Risk Prevention Plan 03/01/2020  Post Dischage Appt Complete  Medication Screening Complete  Transportation Screening Complete  Some recent data might be hidden

## 2020-03-01 NOTE — Progress Notes (Signed)
All care  Of [facility] to be done by Wyman Songster.N.

## 2020-03-06 NOTE — Discharge Summary (Signed)
[facility] Discharge Summary   Patient ID:  Chelsey Young MRN: [id number] DOB/AGE: Dec 02, 1940 79 y.o.  Admit date: 02/29/2020 Discharge date: 03/01/20 Date of Surgery: 02/29/2020 Surgeon: Surgeon(s): Marty Heck, MD  Admission Diagnosis: Abdominal aortic aneurysm (AAA) [facility]) [I71.4]  Discharge Diagnoses:  Abdominal aortic aneurysm (AAA) ([place]) [I71.4]  Secondary Diagnoses: Past Medical History:  Diagnosis Date  . Arthritis   . Cancer (HCC)    CERVICAL  . COPD (chronic obstructive pulmonary disease) ([place])   . Hypercholesteremia   . Hypertension   . Hypokalemia   . Stroke [facility])    TIA  . Thoracic aortic aneurysm without rupture ([place]) 10/29/2016   PER CT CHEST  . Vitamin D deficiency     Procedure(s): ABDOMINAL AORTIC ENDOVASCULAR STENT GRAFT  Discharged Condition: good  HPI: 79 year old female presentedfor follow-uptodiscuss CTA abdomen pelvis afterincidentally discovered saccular aneurysm arising from infrarenal abdominal aorta on MRI.After review of CTA Ihaverecommended repair with endovascular stent graft.  CTA Infrarenal abdominal aortic chronic appearing saccular aneurysm versus large chronic penetrating atherosclerotic ulcer. Abnormality measures 2 cm in maximal diameter. Including the native aorta maximal diameter of 3 cm.     Hospital Course:  Chelsey Young is a 79 y.o. female is S/P  Procedure(s): ABDOMINAL AORTIC ENDOVASCULAR STENT GRAFT I have seen and evaluated the patient. I agree with the PA note as documented above.  Postop day 1 status post EVAR for infrarenal abdominal aortic saccular aneurysm.  Left groin looks good.  Palpable left pedal pulse.  Hemodynamics have been stable overnight.  Hemoglobin stable.  She did have a slight increase in creatinine from 1.2 to 1.5.  We will hydrate this morning and get another BMP this afternoon and if stable plan for discharge.  Follow-up in 1 month with CTA abdomen  pelvis.  Discharge on aspirin statin. Cr at discharge 1.4 trending down.   Significant Diagnostic Studies: CBC Lab Results  Component Value Date   WBC 13.2 (H) 03/01/2020   HGB 12.2 03/01/2020   HCT 38.3 03/01/2020   MCV 94.3 03/01/2020   PLT 246 03/01/2020    BMET    Component Value Date/Time   NA 140 03/01/2020 1034   K 3.6 03/01/2020 1034   CL 103 03/01/2020 1034   CO2 25 03/01/2020 1034   GLUCOSE 164 (H) 03/01/2020 1034   BUN 19 03/01/2020 1034   CREATININE 1.43 (H) 03/01/2020 1034   CALCIUM 9.1 03/01/2020 1034   GFRNONAA 34 (L) 03/01/2020 1034   GFRAA 40 (L) 03/01/2020 1034   COAG Lab Results  Component Value Date   INR 1.1 02/29/2020   INR 1.0 02/20/2020     Disposition:  Discharge to :Home Discharge Instructions    Call MD for:  redness, tenderness, or signs of infection (pain, swelling, bleeding, redness, odor or green/yellow discharge around incision site)   Complete by: As directed    Call MD for:  severe or increased pain, loss or decreased feeling  in affected limb(s)   Complete by: As directed    Call MD for:  temperature >100.5   Complete by: As directed    Resume previous diet   Complete by: As directed      Allergies as of 03/01/2020   No Known Allergies     Medication List    TAKE these medications   albuterol (2.5 MG/3ML) 0.083% nebulizer solution Commonly known as: PROVENTIL Take 2.5 mg by nebulization every 6 (six) hours as needed for wheezing or shortness  of breath.   aspirin 81 MG tablet Take 81 mg by mouth daily.   oxyCODONE-acetaminophen 5-325 MG tablet Commonly known as: PERCOCET/ROXICET Take 1-2 tablets by mouth every 6 (six) hours as needed for moderate pain.   Potassium 99 MG Tabs Take 2 tablets by mouth daily.   PRESERVISION AREDS 2+MULTI VIT PO Take 1 capsule by mouth in the morning and at bedtime.   simvastatin 40 MG tablet Commonly known as: ZOCOR Take 40 mg by mouth every evening.   Trelegy Ellipta  100-62.5-25 MCG/INH Aepb Generic drug: Fluticasone-Umeclidin-Vilant Inhale 1 puff into the lungs daily.   triamterene-hydrochlorothiazide 37.5-25 MG capsule Commonly known as: DYAZIDE Take 1 capsule by mouth daily.   Vitamin D3 125 MCG (5000 UT) Caps Take 5,000 Units by mouth daily.      Verbal and written Discharge instructions given to the patient. Wound care per Discharge AVS  Follow-up Information    Marty Heck, MD In 4 weeks.   Specialty: Vascular Surgery Why: The office will call the patient with an appointment (sent) Contact information: [address] [place] [address] [phone]               Signed: Roxy Horseman 03/06/2020, 8:27 AM - For VQI Registry use --- Instructions: Press F2 to tab through selections.  Delete question if not applicable.   Post-op:  Time to Extubation: [x ] In OR, [ ]  < 12 hrs, [ ]  12-24 hrs, [ ]  >=24 hrs Vasopressors Req. Post-op: No MI: x[ ]  No, [ ]  Troponin only, [ ]  EKG or Clinical New Arrhythmia: No CHF: No ICU Stay: 0 days Transfusion: No  If yes, 0 units given  Complications: Resp failure: [x ] none, [ ]  Pneumonia, [ ]  Ventilator Chg in renal function: [x ] none, [ ]  Inc. Cr > 0.5, [ ]  Temp. Dialysis, [ ]  Permanent dialysis Leg ischemia: [x ] No, [ ]  Yes, no Surgery needed, [ ]  Yes, Surgery needed, [ ]  Amputation Bowel ischemia: [x ] No, [ ]  Medical Rx, [ ]  Surgical Rx Wound complication: [x ] No, [ ]  Superficial separation/infection, [ ]  Return to OR Return to OR: No  Return to OR for bleeding: No Stroke: [ ]  None, [ ]  Minor, [ ]  Major  Discharge medications: Statin use:  Yes ASA use:  Yes Plavix use:  No  for medical reason   Beta blocker use:  No  for medical reason

## 2020-03-08 ENCOUNTER — Other Ambulatory Visit: Payer: Self-pay

## 2020-03-08 DIAGNOSIS — I714 Abdominal aortic aneurysm, without rupture, unspecified: Secondary | ICD-10-CM

## 2020-03-20 ENCOUNTER — Ambulatory Visit
Admission: RE | Admit: 2020-03-20 | Discharge: 2020-03-20 | Disposition: A | Discharge status: Home / Self Care | Payer: Medicare HMO | Source: Ambulatory Visit | Attending: Vascular Surgery | Admitting: Vascular Surgery

## 2020-03-20 ENCOUNTER — Inpatient Hospital Stay: Admission: RE | Admit: 2020-03-20 | Payer: Medicare HMO | Source: Ambulatory Visit

## 2020-03-20 DIAGNOSIS — Z01818 Encounter for other preprocedural examination: Secondary | ICD-10-CM | POA: Diagnosis not present

## 2020-03-20 DIAGNOSIS — K623 Rectal prolapse: Secondary | ICD-10-CM | POA: Diagnosis not present

## 2020-03-20 DIAGNOSIS — K55069 Acute infarction of intestine, part and extent unspecified: Secondary | ICD-10-CM | POA: Diagnosis not present

## 2020-03-20 DIAGNOSIS — I714 Abdominal aortic aneurysm, without rupture, unspecified: Secondary | ICD-10-CM

## 2020-03-20 DIAGNOSIS — K551 Chronic vascular disorders of intestine: Secondary | ICD-10-CM | POA: Diagnosis not present

## 2020-03-20 MED ORDER — IOPAMIDOL (ISOVUE-370) INJECTION 76%
60.0000 mL | Freq: Once | INTRAVENOUS | Status: AC | PRN
  Administered 2020-03-20: 60 mL via INTRAVENOUS

## 2020-03-21 ENCOUNTER — Other Ambulatory Visit: Payer: Medicare HMO

## 2020-03-26 ENCOUNTER — Other Ambulatory Visit (HOSPITAL_COMMUNITY): Payer: Medicare HMO

## 2020-03-26 ENCOUNTER — Encounter: Payer: Self-pay | Admitting: Vascular Surgery

## 2020-03-26 ENCOUNTER — Ambulatory Visit (INDEPENDENT_AMBULATORY_CARE_PROVIDER_SITE_OTHER): Payer: Self-pay | Admitting: Vascular Surgery

## 2020-03-26 ENCOUNTER — Other Ambulatory Visit: Payer: Self-pay

## 2020-03-26 VITALS — BP 128/82 | HR 73 | Temp 98.1°F | Resp 20 | Ht 60.0 in | Wt 111.8 lb

## 2020-03-26 DIAGNOSIS — I714 Abdominal aortic aneurysm, without rupture, unspecified: Secondary | ICD-10-CM

## 2020-03-26 NOTE — Progress Notes (Signed)
Patient name: Chelsey Young MRN: [id number] DOB: 20-Feb-1941 Sex: female  REASON FOR VISIT: Postop check after endograft treatment of saccular aneurysm of the infrarenal abdominal aorta  HPI: Chelsey Young is a 79 y.o. female with multiple medical problems noted below who presents for postop check after endovascular treatment of saccular aneurysm of her infrarenal abdominal aorta.  She recently underwent ultrasound-guided access of the left common femoral artery on 02/29/2020.  Ultimately we placed two aortic cuffs that were 20 mm x 4.5 cm to treat a saccular aneurysm of the infrarenal abdominal aorta.  She has no complaints on follow-up today.  Taking an aspirin daily.  Past Medical History:  Diagnosis Date  . Arthritis   . Cancer (HCC)    CERVICAL  . COPD (chronic obstructive pulmonary disease) ([place])   . Hypercholesteremia   . Hypertension   . Hypokalemia   . Stroke [facility])    TIA  . Thoracic aortic aneurysm without rupture ([place]) 10/29/2016   PER CT CHEST  . Vitamin D deficiency     Past Surgical History:  Procedure Laterality Date  . ABDOMINAL AORTIC ENDOVASCULAR STENT GRAFT N/A 02/29/2020   Procedure: ABDOMINAL AORTIC ENDOVASCULAR STENT GRAFT;  Surgeon: Marty Heck, MD;  Location: [facility] OR;  Service: Vascular;  Laterality: N/A;  . ABDOMINAL HYSTERECTOMY     WITH BSO  . CHOLECYSTECTOMY      Family History  Problem Relation Age of Onset  . Myasthenia gravis Daughter   . Epilepsy Son     SOCIAL HISTORY: Social History   Tobacco Use  . Smoking status: Current Every Day Smoker    Packs/day: 0.50    Years: 60.00    Pack years: 30.00    Types: Cigarettes  . Smokeless tobacco: Never Used  Substance Use Topics  . Alcohol use: No    No Known Allergies  Current Outpatient Medications  Medication Sig Dispense Refill  . albuterol (PROVENTIL) (2.5 MG/3ML) 0.083% nebulizer solution Take 2.5 mg by nebulization every 6 (six) hours as needed for wheezing or  shortness of breath.    Marland Kitchen aspirin 81 MG tablet Take 81 mg by mouth daily.    . calcitonin, salmon, (MIACALCIN/FORTICAL) 200 UNIT/ACT nasal spray SMARTSIG:Both Nares    . Cholecalciferol (VITAMIN D3) 5000 units CAPS Take 5,000 Units by mouth daily.     . Multiple Vitamins-Minerals (PRESERVISION AREDS 2+MULTI VIT PO) Take 1 capsule by mouth in the morning and at bedtime.    . Potassium 99 MG TABS Take 2 tablets by mouth daily.    . simvastatin (ZOCOR) 40 MG tablet Take 40 mg by mouth every evening.     . TRELEGY ELLIPTA 100-62.5-25 MCG/INH AEPB Inhale 1 puff into the lungs daily.    Marland Kitchen triamterene-hydrochlorothiazide (DYAZIDE) 37.5-25 MG capsule Take 1 capsule by mouth daily.    . valACYclovir (VALTREX) 1000 MG tablet Take by mouth.     No current facility-administered medications for this visit.    REVIEW OF SYSTEMS:  [X]  denotes positive finding, [ ]  denotes negative finding Cardiac  Comments:  Chest pain or chest pressure:    Shortness of breath upon exertion:    Short of breath when lying flat:    Irregular heart rhythm:        Vascular    Pain in calf, thigh, or hip brought on by ambulation:    Pain in feet at night that wakes you up from your sleep:     Blood clot in your  veins:    Leg swelling:         Pulmonary    Oxygen at home:    Productive cough:     Wheezing:         Neurologic    Sudden weakness in arms or legs:     Sudden numbness in arms or legs:     Sudden onset of difficulty speaking or slurred speech:    Temporary loss of vision in one eye:     Problems with dizziness:         Gastrointestinal    Blood in stool:     Vomited blood:         Genitourinary    Burning when urinating:     Blood in urine:        Psychiatric    Major depression:         Hematologic    Bleeding problems:    Problems with blood clotting too easily:        Skin    Rashes or ulcers:        Constitutional    Fever or chills:      PHYSICAL EXAM: Vitals:   03/26/20  0904  BP: 128/82  Pulse: 73  Resp: 20  Temp: 98.1 F (36.7 C)  SpO2: 95%  Weight: 111 lb 12.8 oz (50.7 kg)  Height: 5' (1.524 m)    GENERAL: The patient is a well-nourished female, in no acute distress. The vital signs are documented above. CARDIAC: There is a regular rate and rhythm.  VASCULAR:  Palpable left femoral pulse No left groin hematoma Palpable left dorsalis pedis pulse PULMONARY: There is good air exchange bilaterally without wheezing or rales. ABDOMEN: Soft and non-tender with normal pitched bowel sounds.  MUSCULOSKELETAL: There are no major deformities or cyanosis. NEUROLOGIC: No focal weakness or paresthesias are detected.   DATA:   I reviewed her CTA abdomen pelvis on 03/23/20 and the aortic cuffs look well-positioned with complete exclusion of the saccular aneurysm of her infrarenal abdominal aorta.  Assessment/Plan:  79 year old female status post endovascular treatment of her saccular aneurysm in the infrarenal abdominal aorta with aortic cuffs x2.  Discussed that after review of CTA these look well-positioned with complete exclusion of the aneurysm.  Her left groin access site looks good.  She has a palpable left femoral and left dorsalis pedis pulse.  I will have her follow-up in 1 year with repeat CTA abdomen pelvis for surveillance of aneurysm and stent graft.  Discussed that we often perform surveillance with ultrasound here in our office but I think her aneurysm will be difficult to visualize on [place].  If everything looks good in a year can hopefully follow her with ultrasounds after this timeline.  Continue aspirin daily from my standpoint.   Marty Heck, MD [facility] [place] Office: [phone]

## 2020-04-02 ENCOUNTER — Encounter: Payer: Medicare HMO | Admitting: Vascular Surgery

## 2020-04-09 ENCOUNTER — Encounter (INDEPENDENT_AMBULATORY_CARE_PROVIDER_SITE_OTHER): Payer: Medicare HMO | Admitting: Ophthalmology

## 2020-04-15 ENCOUNTER — Encounter (INDEPENDENT_AMBULATORY_CARE_PROVIDER_SITE_OTHER): Payer: Self-pay | Admitting: Ophthalmology

## 2020-04-15 ENCOUNTER — Other Ambulatory Visit: Payer: Self-pay

## 2020-04-15 ENCOUNTER — Ambulatory Visit (INDEPENDENT_AMBULATORY_CARE_PROVIDER_SITE_OTHER): Payer: Medicare HMO | Admitting: Ophthalmology

## 2020-04-15 DIAGNOSIS — H353134 Nonexudative age-related macular degeneration, bilateral, advanced atrophic with subfoveal involvement: Secondary | ICD-10-CM

## 2020-04-15 DIAGNOSIS — H3581 Retinal edema: Secondary | ICD-10-CM | POA: Diagnosis not present

## 2020-04-15 DIAGNOSIS — H35033 Hypertensive retinopathy, bilateral: Secondary | ICD-10-CM

## 2020-04-15 DIAGNOSIS — Z961 Presence of intraocular lens: Secondary | ICD-10-CM

## 2020-04-15 DIAGNOSIS — I10 Essential (primary) hypertension: Secondary | ICD-10-CM | POA: Diagnosis not present

## 2020-04-15 NOTE — Progress Notes (Signed)
[facility] Note  04/15/2020     CHIEF COMPLAINT Patient presents for Retina Follow Up   HISTORY OF PRESENT ILLNESS: Chelsey Young is a 79 y.o. female who presents to the clinic today for:   HPI    Retina Follow Up    Patient presents with  Dry AMD.  In both eyes.  This started weeks ago.  Severity is moderate.  Duration of weeks.  Since onset it is stable.  I, the attending physician,  performed the HPI with the patient and updated documentation appropriately.          Comments    Pt states vision is about the same OU.  Pt denies eye pain or discomfort and denies any new or worsening floaters or fol OU.       Last edited by Bernarda Caffey, MD on 04/15/2020  9:33 AM. (History)    pt states she has had sx for an abdominal aneurysm since her last visit here, she states she is not noticing any new problems with her vision, she states she is checking her vision on her Amsler grid and taking AREDS 2  Referring physician: Marin Comment [address], [place],  [place] [id number]  HISTORICAL INFORMATION:   Selected notes from the [facility] Referred by Dr. Marin Comment for concern of macular degeneration OU LEE: 12/22/2019 BCVA: 20/60; 20/60   CURRENT MEDICATIONS: No current outpatient medications on file. (Ophthalmic Drugs)   No current facility-administered medications for this visit. (Ophthalmic Drugs)   Current Outpatient Medications (Other)  Medication Sig  . albuterol (PROVENTIL) (2.5 MG/3ML) 0.083% nebulizer solution Take 2.5 mg by nebulization every 6 (six) hours as needed for wheezing or shortness of breath.  Marland Kitchen aspirin 81 MG tablet Take 81 mg by mouth daily.  . calcitonin, salmon, (MIACALCIN/FORTICAL) 200 UNIT/ACT nasal spray SMARTSIG:Both Nares  . Cholecalciferol (VITAMIN D3) 5000 units CAPS Take 5,000 Units by mouth daily.   . Multiple Vitamins-Minerals (PRESERVISION AREDS 2+MULTI VIT PO) Take 1 capsule by mouth in the morning  and at bedtime.  . Potassium 99 MG TABS Take 2 tablets by mouth daily.  . simvastatin (ZOCOR) 40 MG tablet Take 40 mg by mouth every evening.   . TRELEGY ELLIPTA 100-62.5-25 MCG/INH AEPB Inhale 1 puff into the lungs daily.  Marland Kitchen triamterene-hydrochlorothiazide (DYAZIDE) 37.5-25 MG capsule Take 1 capsule by mouth daily.  . valACYclovir (VALTREX) 1000 MG tablet Take by mouth.   No current facility-administered medications for this visit. (Other)      REVIEW OF SYSTEMS: ROS    Positive for: Eyes   Negative for: Constitutional, Gastrointestinal, Neurological, Skin, Genitourinary, Musculoskeletal, HENT, Endocrine, Cardiovascular, Respiratory, Psychiatric, Allergic/Imm, Heme/Lymph   Last edited by Doneen Poisson on 04/15/2020  8:35 AM. (History)       ALLERGIES No Known Allergies  PAST MEDICAL HISTORY Past Medical History:  Diagnosis Date  . Arthritis   . Cancer (HCC)    CERVICAL  . COPD (chronic obstructive pulmonary disease) ([place])   . Hypercholesteremia   . Hypertension   . Hypokalemia   . Stroke [facility])    TIA  . Thoracic aortic aneurysm without rupture ([place]) 10/29/2016   PER CT CHEST  . Vitamin D deficiency    Past Surgical History:  Procedure Laterality Date  . ABDOMINAL AORTIC ENDOVASCULAR STENT GRAFT N/A 02/29/2020   Procedure: ABDOMINAL AORTIC ENDOVASCULAR STENT GRAFT;  Surgeon: Marty Heck, MD;  Location: [facility];  Service:  Vascular;  Laterality: N/A;  . ABDOMINAL HYSTERECTOMY     WITH BSO  . CHOLECYSTECTOMY      FAMILY HISTORY Family History  Problem Relation Age of Onset  . Myasthenia gravis Daughter   . Epilepsy Son     SOCIAL HISTORY Social History   Tobacco Use  . Smoking status: Current Every Day Smoker    Packs/day: 0.50    Years: 60.00    Pack years: 30.00    Types: Cigarettes  . Smokeless tobacco: Never Used  Vaping Use  . Vaping Use: Never used  Substance Use Topics  . Alcohol use: No  . Drug use: No         OPHTHALMIC  EXAM:  Base Eye Exam    Visual Acuity (Snellen - Linear)      Right Left   Dist [place] 20/60 +2 20/50 -1   Dist ph  NI NI       Tonometry (Tonopen, 8:39 AM)      Right Left   Pressure 14 14       Pupils      Dark Light Shape React APD   Right 3 2 Round Minimal 0   Left 3 2 Round Minimal 0       Visual Fields      Left Right    Full Full       Extraocular Movement      Right Left    Full Full       Neuro/Psych    Oriented x3: Yes   Mood/Affect: Normal       Dilation    Both eyes: 1.0% Mydriacyl, 2.5% Phenylephrine @ 8:39 AM        Slit Lamp and Fundus Exam    Slit Lamp Exam      Right Left   Lids/Lashes Dermatochalasis - upper lid, mild Meibomian gland dysfunction Dermatochalasis - upper lid, mild Meibomian gland dysfunction   Conjunctiva/Sclera White and quiet White and quiet   Cornea Mild arcus, well healed temporal cataract wounds Mild arcus, well healed temporal cataract wounds   Anterior Chamber Deep and quiet Deep and quiet   Iris Round and dilated Round and moderately dilated to 5.64mm   Lens PC IOL in good position PC IOL in good position   Vitreous Vitreous syneresis Vitreous syneresis       Fundus Exam      Right Left   Disc Pink and Sharp, Compact Pink and Sharp, Compact   C/D Ratio 0.3 0.3   Macula Flat, Blunted foveal reflex, Drusen, RPE mottling, clumping and atrophy -- early GA, No heme or edema Flat, Blunted foveal reflex, Drusen, RPE mottling, clumping and atrophy -- early GA, No heme or edema   Vessels attenuated, mild tortuousity attenuated, Tortuous   Periphery Attached, reticular degeneration, No heme  Attached, reticular degeneration, No heme         Refraction    Wearing Rx      Sphere   Right None   Left None          IMAGING AND PROCEDURES  Imaging and Procedures for @TODAY @  OCT, Retina - OU - Both Eyes       Right Eye Quality was good. Central Foveal Thickness: 290. Progression has been stable. Findings include  abnormal foveal contour, no IRF, no SRF, outer retinal atrophy, retinal drusen , epiretinal membrane (?mild progression of ORA).   Left Eye Quality was good. Central Foveal Thickness: 331. Progression has been  stable. Findings include no IRF, abnormal foveal contour, retinal drusen , no SRF, epiretinal membrane, outer retinal atrophy.   Notes *Images captured and stored on drive  Diagnosis / Impression:  non-exu ARMD with GA OU OD: ?mild progression of ORA  Clinical management:  See below  Abbreviations: NFP - Normal foveal profile. CME - cystoid macular edema. PED - pigment epithelial detachment. IRF - intraretinal fluid. SRF - subretinal fluid. EZ - ellipsoid zone. ERM - epiretinal membrane. ORA - outer retinal atrophy. ORT - outer retinal tubulation. SRHM - subretinal hyper-reflective material                 ASSESSMENT/PLAN:    ICD-10-CM   1. Advanced atrophic nonexudative age-related macular degeneration of both eyes with subfoveal involvement  H35.3134   2. Retinal edema  H35.81 OCT, Retina - OU - Both Eyes  3. Essential hypertension  I10   4. Hypertensive retinopathy of both eyes  H35.033   5. Pseudophakia of both eyes  Z96.1     1,2. Age related macular degeneration, non-exudative, both eyes  - advanced atrophic stage w/ +ORA and GA OU  - no significant change from prior  - BCVA 20/60 OD, 20/50 OS  - The incidence, anatomy, and pathology of dry AMD, risk of progression, and the AREDS and AREDS 2 study including smoking risks discussed with patient.  - Recommend amsler grid monitoring  - f/u 4 months, DFE, OCT  3,4. Hypertensive retinopathy OU  - discussed importance of tight BP control  - monitor  5. Pseudophakia OU  - s/p CE/IOL OU (Dr. Talbert Forest, 2018)  - IOLs in good position, doing well  - monitor    Ophthalmic Meds Ordered this visit:  No orders of the defined types were placed in this encounter.     This document serves as a record of services  personally performed by Gardiner Sleeper, MD, PhD. It was created on their behalf by Leeann Must, [place], an ophthalmic technician. The creation of this record is the provider's dictation and/or activities during the visit.    Electronically signed by: Leeann Must, [place] 09.27.2021 1:21 PM   This document serves as a record of services personally performed by Gardiner Sleeper, MD, PhD. It was created on their behalf by San Jetty. Owens Shark, OA an ophthalmic technician. The creation of this record is the provider's dictation and/or activities during the visit.    Electronically signed by: San Jetty. Owens Shark, [place] 09.27.2021 1:21 PM  Gardiner Sleeper, M.D., Ph.D. Diseases & Surgery of the Retina and [place] 04/15/2020   I have reviewed the above documentation for accuracy and completeness, and I agree with the above. Gardiner Sleeper, M.D., Ph.D. 04/15/20 1:21 PM  Abbreviations: M myopia (nearsighted); A astigmatism; H hyperopia (farsighted); P presbyopia; Mrx spectacle prescription;  CTL contact lenses; OD right eye; OS left eye; OU both eyes  XT exotropia; ET esotropia; PEK punctate epithelial keratitis; PEE punctate epithelial erosions; DES dry eye syndrome; MGD meibomian gland dysfunction; ATs artificial tears; PFAT's preservative free artificial tears; [place] nuclear sclerotic cataract; PSC posterior subcapsular cataract; ERM epi-retinal membrane; PVD posterior vitreous detachment; RD retinal detachment; DM diabetes mellitus; DR diabetic retinopathy; NPDR non-proliferative diabetic retinopathy; PDR proliferative diabetic retinopathy; CSME clinically significant macular edema; DME diabetic macular edema; dbh dot blot hemorrhages; CWS cotton wool spot; POAG primary open angle glaucoma; C/D cup-to-disc ratio; HVF humphrey visual field; GVF goldmann visual field; OCT optical coherence tomography; IOP  intraocular pressure; BRVO Branch retinal vein occlusion; CRVO central retinal  vein occlusion; CRAO central retinal artery occlusion; BRAO branch retinal artery occlusion; RT retinal tear; SB scleral buckle; PPV pars plana vitrectomy; VH Vitreous hemorrhage; PRP panretinal laser photocoagulation; IVK intravitreal kenalog; VMT vitreomacular traction; MH Macular hole;  NVD neovascularization of the disc; NVE neovascularization elsewhere; AREDS age related eye disease study; ARMD age related macular degeneration; POAG primary open angle glaucoma; EBMD epithelial/anterior basement membrane dystrophy; ACIOL anterior chamber intraocular lens; IOL intraocular lens; PCIOL posterior chamber intraocular lens; Phaco/IOL phacoemulsification with intraocular lens placement; [place] photorefractive keratectomy; LASIK laser assisted in situ keratomileusis; HTN hypertension; DM diabetes mellitus; COPD chronic obstructive pulmonary disease.

## 2020-06-04 DIAGNOSIS — E7800 Pure hypercholesterolemia, unspecified: Secondary | ICD-10-CM | POA: Diagnosis not present

## 2020-06-19 DIAGNOSIS — Z7982 Long term (current) use of aspirin: Secondary | ICD-10-CM | POA: Diagnosis not present

## 2020-06-19 DIAGNOSIS — I714 Abdominal aortic aneurysm, without rupture: Secondary | ICD-10-CM | POA: Diagnosis not present

## 2020-06-19 DIAGNOSIS — E7800 Pure hypercholesterolemia, unspecified: Secondary | ICD-10-CM | POA: Diagnosis not present

## 2020-06-19 DIAGNOSIS — S22000A Wedge compression fracture of unspecified thoracic vertebra, initial encounter for closed fracture: Secondary | ICD-10-CM | POA: Diagnosis not present

## 2020-06-19 DIAGNOSIS — I10 Essential (primary) hypertension: Secondary | ICD-10-CM | POA: Diagnosis not present

## 2020-06-19 DIAGNOSIS — Z23 Encounter for immunization: Secondary | ICD-10-CM | POA: Diagnosis not present

## 2020-06-19 DIAGNOSIS — J449 Chronic obstructive pulmonary disease, unspecified: Secondary | ICD-10-CM | POA: Diagnosis not present

## 2020-07-05 DIAGNOSIS — M810 Age-related osteoporosis without current pathological fracture: Secondary | ICD-10-CM | POA: Diagnosis not present

## 2020-08-09 NOTE — Progress Notes (Signed)
[facility] Note  08/16/2020     CHIEF COMPLAINT Patient presents for Retina Follow Up   HISTORY OF PRESENT ILLNESS: Chelsey Young is a 80 y.o. female who presents to the clinic today for:   HPI    Retina Follow Up    Patient presents with  Dry AMD.  In both eyes.  This started 4.  Duration of months.  I, the attending physician,  performed the HPI with the patient and updated documentation appropriately.          Comments    Patient here for 4 months retina follow up for non exu ARMD OU. Patient states vision is good in the am. Can't read runs together. Has to close one eye to separate it. OS feels like something in it. On occasion it kind of scraps.       Last edited by Bernarda Caffey, MD on 08/19/2020  4:42 PM. (History)    pt states her vision fades in and out, she uses Refresh BID OU, pt can't tell if it helps her vision or not, she states her vision seems worse at right before she goes to bed, she states she can't read with both eyes open, but if she closes her right eye, she can  Referring physician: Deland Pretty, MD [address] [place],  [place] [address]  HISTORICAL INFORMATION:   Selected notes from the [facility] Referred by Dr. Marin Comment for concern of macular degeneration OU LEE: 12/22/2019 BCVA: 20/60; 20/60   CURRENT MEDICATIONS: No current outpatient medications on file. (Ophthalmic Drugs)   No current facility-administered medications for this visit. (Ophthalmic Drugs)   Current Outpatient Medications (Other)  Medication Sig  . albuterol (PROVENTIL) (2.5 MG/3ML) 0.083% nebulizer solution Take 2.5 mg by nebulization every 6 (six) hours as needed for wheezing or shortness of breath.  Marland Kitchen aspirin 81 MG tablet Take 81 mg by mouth daily.  . calcitonin, salmon, (MIACALCIN/FORTICAL) 200 UNIT/ACT nasal spray SMARTSIG:Both Nares  . Cholecalciferol (VITAMIN D3) 5000 units CAPS Take 5,000 Units by mouth daily.    . Multiple Vitamins-Minerals (PRESERVISION AREDS 2+MULTI VIT PO) Take 1 capsule by mouth in the morning and at bedtime.  . Potassium 99 MG TABS Take 2 tablets by mouth daily.  . simvastatin (ZOCOR) 40 MG tablet Take 40 mg by mouth every evening.   . TRELEGY ELLIPTA 100-62.5-25 MCG/INH AEPB Inhale 1 puff into the lungs daily.  Marland Kitchen triamterene-hydrochlorothiazide (DYAZIDE) 37.5-25 MG capsule Take 1 capsule by mouth daily.  . valACYclovir (VALTREX) 1000 MG tablet Take by mouth.   No current facility-administered medications for this visit. (Other)      REVIEW OF SYSTEMS: ROS    Positive for: Eyes   Negative for: Constitutional, Gastrointestinal, Neurological, Skin, Genitourinary, Musculoskeletal, HENT, Endocrine, Cardiovascular, Respiratory, Psychiatric, Allergic/Imm, Heme/Lymph   Last edited by Theodore Demark, COA on 08/16/2020 10:13 AM. (History)       ALLERGIES No Known Allergies  PAST MEDICAL HISTORY Past Medical History:  Diagnosis Date  . Arthritis   . Cancer (HCC)    CERVICAL  . COPD (chronic obstructive pulmonary disease) ([place])   . Hypercholesteremia   . Hypertension   . Hypokalemia   . Stroke [facility])    TIA  . Thoracic aortic aneurysm without rupture ([place]) 10/29/2016   PER CT CHEST  . Vitamin D deficiency    Past Surgical History:  Procedure Laterality Date  . ABDOMINAL AORTIC ENDOVASCULAR STENT GRAFT N/A  02/29/2020   Procedure: ABDOMINAL AORTIC ENDOVASCULAR STENT GRAFT;  Surgeon: Marty Heck, MD;  [facility];  Service: Vascular;  Laterality: N/A;  . ABDOMINAL HYSTERECTOMY     WITH BSO  . CHOLECYSTECTOMY      FAMILY HISTORY Family History  Problem Relation Age of Onset  . Myasthenia gravis Daughter   . Epilepsy Son     SOCIAL HISTORY Social History   Tobacco Use  . Smoking status: Current Every Day Smoker    Packs/day: 0.50    Years: 60.00    Pack years: 30.00    Types: Cigarettes  . Smokeless tobacco: Never Used  Vaping Use  .  Vaping Use: Never used  Substance Use Topics  . Alcohol use: No  . Drug use: No         OPHTHALMIC EXAM:  Base Eye Exam    Visual Acuity (Snellen - Linear)      Right Left   Dist [place] 20/50 -2 20/50   Dist ph [place] NI NI       Tonometry (Tonopen, 10:09 AM)      Right Left   Pressure 17 17       Pupils      Dark Light Shape React APD   Right 3 2 Round Minimal None   Left 3 2 Round Minimal None       Visual Fields (Counting fingers)      Left Right    Full Full       Extraocular Movement      Right Left    Full Full       Neuro/Psych    Oriented x3: Yes   Mood/Affect: Normal       Dilation    Both eyes: 1.0% Mydriacyl, 2.5% Phenylephrine @ 10:09 AM        Slit Lamp and Fundus Exam    Slit Lamp Exam      Right Left   Lids/Lashes Dermatochalasis - upper lid, mild Meibomian gland dysfunction Dermatochalasis - upper lid, mild Meibomian gland dysfunction   Conjunctiva/Sclera White and quiet White and quiet   Cornea Mild arcus, well healed temporal cataract wounds, 1+ inferior Punctate epithelial erosions Mild arcus, well healed temporal cataract wounds, 1-2+ inferior Punctate epithelial erosions   Anterior Chamber Deep and quiet Deep and quiet   Iris Round and dilated Round and moderately dilated to 5.70mm   Lens PC IOL in good position PC IOL in good position   Vitreous Vitreous syneresis Vitreous syneresis       Fundus Exam      Right Left   Disc Pink and Sharp, Compact, 360 PPA Pink and Sharp, Compact, temporal PPA   C/D Ratio 0.3 0.3   Macula Flat, Blunted foveal reflex, Drusen, RPE mottling, clumping and atrophy -- GA, No heme or edema Flat, Blunted foveal reflex, Drusen, RPE mottling, clumping and atrophy -- GA, focal linear IRH inferior macula, No edema   Vessels attenuated, Tortuous attenuated, Tortuous   Periphery Attached, reticular degeneration, No heme  Attached, reticular degeneration, No heme           IMAGING AND PROCEDURES  Imaging and  Procedures for @TODAY @  OCT, Retina - OU - Both Eyes       Right Eye Quality was good. Central Foveal Thickness: 330. Progression has been stable. Findings include abnormal foveal contour, no IRF, no SRF, outer retinal atrophy, retinal drusen , epiretinal membrane (persistent central ORA/GA).   Left Eye Quality was  good. Central Foveal Thickness: 300. Progression has been stable. Findings include no IRF, abnormal foveal contour, retinal drusen , no SRF, epiretinal membrane, outer retinal atrophy.   Notes *Images captured and stored on drive  Diagnosis / Impression:  non-exu ARMD with GA OU OD: persistent central ORA/GA  Clinical management:  See below  Abbreviations: NFP - Normal foveal profile. CME - cystoid macular edema. PED - pigment epithelial detachment. IRF - intraretinal fluid. SRF - subretinal fluid. EZ - ellipsoid zone. ERM - epiretinal membrane. ORA - outer retinal atrophy. ORT - outer retinal tubulation. SRHM - subretinal hyper-reflective material                 ASSESSMENT/PLAN:    ICD-10-CM   1. Advanced atrophic nonexudative age-related macular degeneration of both eyes with subfoveal involvement  H35.3134   2. Retinal edema  H35.81 OCT, Retina - OU - Both Eyes  3. Essential hypertension  I10   4. Hypertensive retinopathy of both eyes  H35.033   5. Pseudophakia of both eyes  Z96.1     1,2. Age related macular degeneration, non-exudative, both eyes  - advanced atrophic stage w/ +ORA and GA OU  - new focal linear IRH inferior macula OS noted on exam today, 01.28.22  - BCVA 20/50 OU  - Recommend amsler grid monitoring  - f/u 4 weeks, DFE, OCT  3,4. Hypertensive retinopathy OU  - discussed importance of tight BP control  - monitor  5. Pseudophakia OU  - s/p CE/IOL OU (Dr. Talbert Forest, 2018)  - IOLs in good position, doing well  - monitor    Ophthalmic Meds Ordered this visit:  No orders of the defined types were placed in this encounter.     This  document serves as a record of services personally performed by Gardiner Sleeper, MD, PhD. It was created on their behalf by San Jetty. Owens Shark, OA an ophthalmic technician. The creation of this record is the provider's dictation and/or activities during the visit.    Electronically signed by: San Jetty. [place], [place] 01.21.2022 4:44 PM  Gardiner Sleeper, M.D., Ph.D. Diseases & Surgery of the Retina and Vitreous [facility]  I have reviewed the above documentation for accuracy and completeness, and I agree with the above. Gardiner Sleeper, M.D., Ph.D. 08/19/20 4:44 PM    Abbreviations: M myopia (nearsighted); A astigmatism; H hyperopia (farsighted); P presbyopia; Mrx spectacle prescription;  CTL contact lenses; OD right eye; OS left eye; OU both eyes  XT exotropia; ET esotropia; PEK punctate epithelial keratitis; PEE punctate epithelial erosions; DES dry eye syndrome; MGD meibomian gland dysfunction; ATs artificial tears; PFAT's preservative free artificial tears; [place] nuclear sclerotic cataract; PSC posterior subcapsular cataract; ERM epi-retinal membrane; PVD posterior vitreous detachment; RD retinal detachment; DM diabetes mellitus; DR diabetic retinopathy; NPDR non-proliferative diabetic retinopathy; PDR proliferative diabetic retinopathy; CSME clinically significant macular edema; DME diabetic macular edema; dbh dot blot hemorrhages; CWS cotton wool spot; POAG primary open angle glaucoma; C/D cup-to-disc ratio; HVF humphrey visual field; GVF goldmann visual field; OCT optical coherence tomography; IOP intraocular pressure; BRVO Branch retinal vein occlusion; CRVO central retinal vein occlusion; CRAO central retinal artery occlusion; BRAO branch retinal artery occlusion; RT retinal tear; SB scleral buckle; PPV pars plana vitrectomy; VH Vitreous hemorrhage; PRP panretinal laser photocoagulation; IVK intravitreal kenalog; VMT vitreomacular traction; MH Macular hole;  NVD neovascularization  of the disc; NVE neovascularization elsewhere; AREDS age related eye disease study; ARMD age related macular degeneration; POAG primary  open angle glaucoma; EBMD epithelial/anterior basement membrane dystrophy; ACIOL anterior chamber intraocular lens; IOL intraocular lens; PCIOL posterior chamber intraocular lens; Phaco/IOL phacoemulsification with intraocular lens placement; [place] photorefractive keratectomy; LASIK laser assisted in situ keratomileusis; HTN hypertension; DM diabetes mellitus; COPD chronic obstructive pulmonary disease.

## 2020-08-16 ENCOUNTER — Encounter (INDEPENDENT_AMBULATORY_CARE_PROVIDER_SITE_OTHER): Payer: Self-pay | Admitting: Ophthalmology

## 2020-08-16 ENCOUNTER — Other Ambulatory Visit: Payer: Self-pay

## 2020-08-16 ENCOUNTER — Ambulatory Visit (INDEPENDENT_AMBULATORY_CARE_PROVIDER_SITE_OTHER): Payer: Medicare PPO | Admitting: Ophthalmology

## 2020-08-16 DIAGNOSIS — Z961 Presence of intraocular lens: Secondary | ICD-10-CM | POA: Diagnosis not present

## 2020-08-16 DIAGNOSIS — H3581 Retinal edema: Secondary | ICD-10-CM

## 2020-08-16 DIAGNOSIS — I10 Essential (primary) hypertension: Secondary | ICD-10-CM

## 2020-08-16 DIAGNOSIS — H353134 Nonexudative age-related macular degeneration, bilateral, advanced atrophic with subfoveal involvement: Secondary | ICD-10-CM | POA: Diagnosis not present

## 2020-08-16 DIAGNOSIS — H35033 Hypertensive retinopathy, bilateral: Secondary | ICD-10-CM

## 2020-08-19 ENCOUNTER — Encounter (INDEPENDENT_AMBULATORY_CARE_PROVIDER_SITE_OTHER): Payer: Self-pay | Admitting: Ophthalmology

## 2020-09-06 NOTE — Progress Notes (Signed)
[facility] Note  09/13/2020     CHIEF COMPLAINT Patient presents for Retina Follow Up   HISTORY OF PRESENT ILLNESS: Chelsey Young is a 80 y.o. female who presents to the clinic today for:   HPI    Retina Follow Up    Patient presents with  Wet AMD.  In both eyes.  This started 4 weeks ago.  I, the attending physician,  performed the HPI with the patient and updated documentation appropriately.          Comments    Patient here for 4 weeks exu ARMD OU. Patient states vision about the same. Has a headache above the brow area. No sharp pain.       Last edited by Bernarda Caffey, MD on 09/13/2020  1:07 PM. (History)    pt states vision is stable   Referring physician: Deland Pretty, MD [place],  [place] [address]  HISTORICAL INFORMATION:   Selected notes from the MEDICAL RECORD NUMBER Referred by Dr. Marin Comment for concern of macular degeneration OU LEE: 12/22/2019 BCVA: 20/60; 20/60   CURRENT MEDICATIONS: No current outpatient medications on file. (Ophthalmic Drugs)   No current facility-administered medications for this visit. (Ophthalmic Drugs)   Current Outpatient Medications (Other)  Medication Sig  . albuterol (PROVENTIL) (2.5 MG/3ML) 0.083% nebulizer solution Take 2.5 mg by nebulization every 6 (six) hours as needed for wheezing or shortness of breath.  Marland Kitchen aspirin 81 MG tablet Take 81 mg by mouth daily.  . calcitonin, salmon, (MIACALCIN/FORTICAL) 200 UNIT/ACT nasal spray SMARTSIG:Both Nares  . Cholecalciferol (VITAMIN D3) 5000 units CAPS Take 5,000 Units by mouth daily.   . Multiple Vitamins-Minerals (PRESERVISION AREDS 2+MULTI VIT PO) Take 1 capsule by mouth in the morning and at bedtime.  . Potassium 99 MG TABS Take 2 tablets by mouth daily.  . simvastatin (ZOCOR) 40 MG tablet Take 40 mg by mouth every evening.   . TRELEGY ELLIPTA 100-62.5-25 MCG/INH AEPB Inhale 1 puff into the lungs daily.  Marland Kitchen  triamterene-hydrochlorothiazide (DYAZIDE) 37.5-25 MG capsule Take 1 capsule by mouth daily.  . valACYclovir (VALTREX) 1000 MG tablet Take by mouth.   No current facility-administered medications for this visit. (Other)      REVIEW OF SYSTEMS: ROS    Positive for: Eyes   Negative for: Constitutional, Gastrointestinal, Neurological, Skin, Genitourinary, Musculoskeletal, HENT, Endocrine, Cardiovascular, Respiratory, Psychiatric, Allergic/Imm, Heme/Lymph   Last edited by Theodore Demark, COA on 09/13/2020 10:24 AM. (History)       ALLERGIES No Known Allergies  PAST MEDICAL HISTORY Past Medical History:  Diagnosis Date  . Arthritis   . Cancer (HCC)    CERVICAL  . COPD (chronic obstructive pulmonary disease) ([place])   . Hypercholesteremia   . Hypertension   . Hypokalemia   . Stroke [facility])    TIA  . Thoracic aortic aneurysm without rupture ([place]) 10/29/2016   PER CT CHEST  . Vitamin D deficiency    Past Surgical History:  Procedure Laterality Date  . ABDOMINAL AORTIC ENDOVASCULAR STENT GRAFT N/A 02/29/2020   Procedure: ABDOMINAL AORTIC ENDOVASCULAR STENT GRAFT;  Surgeon: Marty Heck, MD;  Location: [facility];  Service: Vascular;  Laterality: N/A;  . ABDOMINAL HYSTERECTOMY     WITH BSO  . CHOLECYSTECTOMY      FAMILY HISTORY Family History  Problem Relation Age of Onset  . Myasthenia gravis Daughter   . Epilepsy Son     SOCIAL HISTORY Social History  Tobacco Use  . Smoking status: Current Every Day Smoker    Packs/day: 0.50    Years: 60.00    Pack years: 30.00    Types: Cigarettes  . Smokeless tobacco: Never Used  Vaping Use  . Vaping Use: Never used  Substance Use Topics  . Alcohol use: No  . Drug use: No         OPHTHALMIC EXAM:  Base Eye Exam    Visual Acuity (Snellen - Linear)      Right Left   Dist [place] 20/50 +1 20/50   Dist ph [place] NI NI       Tonometry (Tonopen, 10:22 AM)      Right Left   Pressure 13 13       Pupils      Dark Light  Shape React APD   Right 3 2 Round Minimal None   Left 3 2 Round Minimal None       Visual Fields (Counting fingers)      Left Right    Full Full       Extraocular Movement      Right Left    Full Full       Neuro/Psych    Oriented x3: Yes   Mood/Affect: Normal       Dilation    Both eyes: 1.0% Mydriacyl, 2.5% Phenylephrine @ 10:22 AM        Slit Lamp and Fundus Exam    Slit Lamp Exam      Right Left   Lids/Lashes Dermatochalasis - upper lid, mild Meibomian gland dysfunction Dermatochalasis - upper lid, mild Meibomian gland dysfunction   Conjunctiva/Sclera White and quiet White and quiet   Cornea Mild arcus, well healed temporal cataract wounds, 1+ inferior Punctate epithelial erosions Mild arcus, well healed temporal cataract wounds, 1+ inferior Punctate epithelial erosions   Anterior Chamber Deep and quiet Deep and quiet   Iris Round and dilated Round and moderately dilated to 5.36mm   Lens PC IOL in good position PC IOL in good position   Vitreous Vitreous syneresis Vitreous syneresis       Fundus Exam      Right Left   Disc Pink and Sharp, Compact, 360 PPA Pink and Sharp, Compact, temporal PPA   C/D Ratio 0.4 0.3   Macula Flat, Blunted foveal reflex, Drusen, RPE mottling, clumping and atrophy -- GA, No heme or edema Flat, Blunted foveal reflex, Drusen, RPE mottling, clumping and atrophy -- GA, focal linear IRH inferior macula -- slightly improved, No edema   Vessels attenuated, Tortuous attenuated, Tortuous   Periphery Attached, reticular degeneration, No heme  Attached, reticular degeneration, No heme         Refraction    Wearing Rx      Sphere   Right None   Left None          IMAGING AND PROCEDURES  Imaging and Procedures for @TODAY @  OCT, Retina - OU - Both Eyes       Right Eye Quality was good. Central Foveal Thickness: 324. Progression has been stable. Findings include abnormal foveal contour, no IRF, no SRF, outer retinal atrophy, retinal  drusen , epiretinal membrane (persistent central ORA/GA).   Left Eye Quality was good. Central Foveal Thickness: 280. Progression has been stable. Findings include no IRF, abnormal foveal contour, retinal drusen , no SRF, epiretinal membrane, outer retinal atrophy.   Notes *Images captured and stored on drive  Diagnosis / Impression:  non-exu ARMD with  GA OU -- stable  Clinical management:  See below  Abbreviations: NFP - Normal foveal profile. CME - cystoid macular edema. PED - pigment epithelial detachment. IRF - intraretinal fluid. SRF - subretinal fluid. EZ - ellipsoid zone. ERM - epiretinal membrane. ORA - outer retinal atrophy. ORT - outer retinal tubulation. SRHM - subretinal hyper-reflective material                 ASSESSMENT/PLAN:    ICD-10-CM   1. Advanced atrophic nonexudative age-related macular degeneration of both eyes with subfoveal involvement  H35.3134   2. Retinal edema  H35.81 OCT, Retina - OU - Both Eyes  3. Essential hypertension  I10   4. Hypertensive retinopathy of both eyes  H35.033   5. Pseudophakia of both eyes  Z96.1     1,2. Age related macular degeneration, non-exudative, both eyes  - advanced atrophic stage w/ +ORA and GA OU  - focal linear IRH inferior macula OS noted on exam, 01.28.22 -- slightly improved on exam today  - no fluid or edema on OCT or exam  - BCVA 20/50 OU stable  - Recommend amsler grid monitoring  - f/u 3 months, sooner prn -- DFE, OCT  3,4. Hypertensive retinopathy OU  - discussed importance of tight BP control  - monitor  5. Pseudophakia OU  - s/p CE/IOL OU (Dr. Talbert Forest, 2018)  - IOLs in good position, doing well  - monitor    Ophthalmic Meds Ordered this visit:  No orders of the defined types were placed in this encounter.     This document serves as a record of services personally performed by Gardiner Sleeper, MD, PhD. It was created on their behalf by San Jetty. Owens Shark, OA an ophthalmic technician. The  creation of this record is the provider's dictation and/or activities during the visit.    Electronically signed by: San Jetty. Owens Shark, [place] 02.18.2022 1:12 PM  Gardiner Sleeper, M.D., Ph.D. Diseases & Surgery of the Retina and Vitreous [facility]  I have reviewed the above documentation for accuracy and completeness, and I agree with the above. Gardiner Sleeper, M.D., Ph.D. 09/13/20 1:12 PM   Abbreviations: M myopia (nearsighted); A astigmatism; H hyperopia (farsighted); P presbyopia; Mrx spectacle prescription;  CTL contact lenses; OD right eye; OS left eye; OU both eyes  XT exotropia; ET esotropia; PEK punctate epithelial keratitis; PEE punctate epithelial erosions; DES dry eye syndrome; MGD meibomian gland dysfunction; ATs artificial tears; PFAT's preservative free artificial tears; [place] nuclear sclerotic cataract; PSC posterior subcapsular cataract; ERM epi-retinal membrane; PVD posterior vitreous detachment; RD retinal detachment; DM diabetes mellitus; DR diabetic retinopathy; NPDR non-proliferative diabetic retinopathy; PDR proliferative diabetic retinopathy; CSME clinically significant macular edema; DME diabetic macular edema; dbh dot blot hemorrhages; CWS cotton wool spot; POAG primary open angle glaucoma; C/D cup-to-disc ratio; HVF humphrey visual field; GVF goldmann visual field; OCT optical coherence tomography; IOP intraocular pressure; BRVO Branch retinal vein occlusion; CRVO central retinal vein occlusion; CRAO central retinal artery occlusion; BRAO branch retinal artery occlusion; RT retinal tear; SB scleral buckle; PPV pars plana vitrectomy; VH Vitreous hemorrhage; PRP panretinal laser photocoagulation; IVK intravitreal kenalog; VMT vitreomacular traction; MH Macular hole;  NVD neovascularization of the disc; NVE neovascularization elsewhere; AREDS age related eye disease study; ARMD age related macular degeneration; POAG primary open angle glaucoma; EBMD  epithelial/anterior basement membrane dystrophy; ACIOL anterior chamber intraocular lens; IOL intraocular lens; PCIOL posterior chamber intraocular lens; Phaco/IOL phacoemulsification with intraocular lens placement; [place]  photorefractive keratectomy; LASIK laser assisted in situ keratomileusis; HTN hypertension; DM diabetes mellitus; COPD chronic obstructive pulmonary disease.

## 2020-09-13 ENCOUNTER — Ambulatory Visit (INDEPENDENT_AMBULATORY_CARE_PROVIDER_SITE_OTHER): Payer: Medicare PPO | Admitting: Ophthalmology

## 2020-09-13 ENCOUNTER — Other Ambulatory Visit: Payer: Self-pay

## 2020-09-13 ENCOUNTER — Encounter (INDEPENDENT_AMBULATORY_CARE_PROVIDER_SITE_OTHER): Payer: Self-pay | Admitting: Ophthalmology

## 2020-09-13 DIAGNOSIS — I10 Essential (primary) hypertension: Secondary | ICD-10-CM

## 2020-09-13 DIAGNOSIS — Z961 Presence of intraocular lens: Secondary | ICD-10-CM | POA: Diagnosis not present

## 2020-09-13 DIAGNOSIS — H35033 Hypertensive retinopathy, bilateral: Secondary | ICD-10-CM

## 2020-09-13 DIAGNOSIS — H3581 Retinal edema: Secondary | ICD-10-CM | POA: Diagnosis not present

## 2020-09-13 DIAGNOSIS — H353134 Nonexudative age-related macular degeneration, bilateral, advanced atrophic with subfoveal involvement: Secondary | ICD-10-CM
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
## 2020-12-10 DIAGNOSIS — M810 Age-related osteoporosis without current pathological fracture: Secondary | ICD-10-CM | POA: Diagnosis not present

## 2020-12-10 DIAGNOSIS — E559 Vitamin D deficiency, unspecified: Secondary | ICD-10-CM | POA: Diagnosis not present

## 2020-12-10 DIAGNOSIS — R5383 Other fatigue: Secondary | ICD-10-CM | POA: Diagnosis not present

## 2020-12-10 DIAGNOSIS — E7800 Pure hypercholesterolemia, unspecified: Secondary | ICD-10-CM | POA: Diagnosis not present

## 2020-12-10 DIAGNOSIS — Z0000 Encounter for general adult medical examination without abnormal findings: Secondary | ICD-10-CM | POA: Diagnosis not present

## 2020-12-10 DIAGNOSIS — J449 Chronic obstructive pulmonary disease, unspecified: Secondary | ICD-10-CM | POA: Diagnosis not present

## 2020-12-10 DIAGNOSIS — K219 Gastro-esophageal reflux disease without esophagitis: Secondary | ICD-10-CM | POA: Diagnosis not present

## 2020-12-11 ENCOUNTER — Other Ambulatory Visit: Payer: Self-pay

## 2020-12-11 ENCOUNTER — Encounter (INDEPENDENT_AMBULATORY_CARE_PROVIDER_SITE_OTHER): Payer: Self-pay | Admitting: Ophthalmology

## 2020-12-11 ENCOUNTER — Ambulatory Visit (INDEPENDENT_AMBULATORY_CARE_PROVIDER_SITE_OTHER): Payer: Medicare PPO | Admitting: Ophthalmology

## 2020-12-11 DIAGNOSIS — H353134 Nonexudative age-related macular degeneration, bilateral, advanced atrophic with subfoveal involvement: Secondary | ICD-10-CM | POA: Diagnosis not present

## 2020-12-11 DIAGNOSIS — H3581 Retinal edema: Secondary | ICD-10-CM

## 2020-12-11 DIAGNOSIS — H35033 Hypertensive retinopathy, bilateral: Secondary | ICD-10-CM | POA: Diagnosis not present

## 2020-12-11 DIAGNOSIS — Z961 Presence of intraocular lens: Secondary | ICD-10-CM

## 2020-12-11 DIAGNOSIS — I10 Essential (primary) hypertension: Secondary | ICD-10-CM | POA: Diagnosis not present

## 2020-12-14 ENCOUNTER — Encounter (INDEPENDENT_AMBULATORY_CARE_PROVIDER_SITE_OTHER): Payer: Self-pay | Admitting: Ophthalmology

## 2021-01-06 DIAGNOSIS — E559 Vitamin D deficiency, unspecified: Secondary | ICD-10-CM | POA: Diagnosis not present

## 2021-01-06 DIAGNOSIS — K219 Gastro-esophageal reflux disease without esophagitis: Secondary | ICD-10-CM | POA: Diagnosis not present

## 2021-01-06 DIAGNOSIS — M542 Cervicalgia: Secondary | ICD-10-CM | POA: Diagnosis not present

## 2021-01-06 DIAGNOSIS — Z0000 Encounter for general adult medical examination without abnormal findings: Secondary | ICD-10-CM | POA: Diagnosis not present

## 2021-01-06 DIAGNOSIS — M5136 Other intervertebral disc degeneration, lumbar region: Secondary | ICD-10-CM | POA: Diagnosis not present

## 2021-03-06 ENCOUNTER — Other Ambulatory Visit: Payer: Self-pay

## 2021-03-06 DIAGNOSIS — I714 Abdominal aortic aneurysm, without rupture, unspecified: Secondary | ICD-10-CM

## 2021-03-12 NOTE — Progress Notes (Signed)
[facility] Note  03/13/2021     CHIEF COMPLAINT Patient presents for Retina Follow Up   HISTORY OF PRESENT ILLNESS: Chelsey Young is a 80 y.o. female who presents to the clinic today for:   HPI     Retina Follow Up   Patient presents with  Dry AMD.  In both eyes.  Duration of 3 months.  Since onset it is stable.  I, the attending physician,  performed the HPI with the patient and updated documentation appropriately.        Comments   Pt here for 3 mo ret f/u for non-exu ARMD OU. Pt states vision is mostly the same though she feels her vision gets progressively worse throughout the day. Starts off as good in the AM but becomes blurrier as the day wears on. No ocular pain or discomfort.       Last edited by Bernarda Caffey, MD on 03/18/2021  4:16 PM.     States: when watching TV, unable to see peoples heads/faces.  Referring physician: Deland Pretty, MD [address] [place],  [place] [id number]  HISTORICAL INFORMATION:   Selected notes from the MEDICAL RECORD NUMBER Referred by Dr. Marin Comment for concern of macular degeneration OU LEE: 12/22/2019 BCVA: 20/60; 20/60   CURRENT MEDICATIONS: No current outpatient medications on file. (Ophthalmic Drugs)   No current facility-administered medications for this visit. (Ophthalmic Drugs)   Current Outpatient Medications (Other)  Medication Sig   albuterol (PROVENTIL) (2.5 MG/3ML) 0.083% nebulizer solution Take 2.5 mg by nebulization every 6 (six) hours as needed for wheezing or shortness of breath.   aspirin 81 MG tablet Take 81 mg by mouth daily.   calcitonin, salmon, (MIACALCIN/FORTICAL) 200 UNIT/ACT nasal spray SMARTSIG:Both Nares   calcium carbonate (OSCAL) 1500 (600 Ca) MG TABS tablet 1 tablet with meals   Cholecalciferol (VITAMIN D3) 5000 units CAPS Take 5,000 Units by mouth daily.    fluticasone furoate-vilanterol (BREO ELLIPTA) 100-25 MCG/INH AEPB INHALE 1 PUFF ONCE DAILY    Multiple Vitamins-Minerals (PRESERVISION AREDS 2+MULTI VIT PO) Take 1 capsule by mouth in the morning and at bedtime.   omeprazole (PRILOSEC) 20 MG capsule 1 capsule 30 minutes before morning meal   Potassium 99 MG TABS Take 2 tablets by mouth daily.   potassium chloride (KLOR-CON) 10 MEQ tablet 1 tablet   simvastatin (ZOCOR) 40 MG tablet Take 40 mg by mouth every evening.    TRELEGY ELLIPTA 100-62.5-25 MCG/INH AEPB Inhale 1 puff into the lungs daily.   triamterene-hydrochlorothiazide (DYAZIDE) 37.5-25 MG capsule Take 1 capsule by mouth daily.   valACYclovir (VALTREX) 1000 MG tablet Take by mouth.   No current facility-administered medications for this visit. (Other)   REVIEW OF SYSTEMS: ROS   Positive for: Neurological, Musculoskeletal, Eyes, Respiratory Negative for: Constitutional, Gastrointestinal, Skin, Genitourinary, HENT, Endocrine, Cardiovascular, Psychiatric, Allergic/Imm, Heme/Lymph Last edited by Kingsley Spittle, COT on 03/13/2021  8:54 AM.     ALLERGIES No Known Allergies  PAST MEDICAL HISTORY Past Medical History:  Diagnosis Date   Arthritis    Cancer ([place])    CERVICAL   COPD (chronic obstructive pulmonary disease) ([place])    Hypercholesteremia    Hypertension    Hypertensive retinopathy    OU   Hypokalemia    Macular degeneration    Non-exu OU   Stroke [facility])    TIA   Thoracic aortic aneurysm without rupture ([place]) 10/29/2016   PER CT CHEST   Vitamin D  deficiency    Past Surgical History:  Procedure Laterality Date   ABDOMINAL AORTIC ENDOVASCULAR STENT GRAFT N/A 02/29/2020   Procedure: ABDOMINAL AORTIC ENDOVASCULAR STENT GRAFT;  Surgeon: Marty Heck, MD;  Location: [facility] OR;  Service: Vascular;  Laterality: N/A;   ABDOMINAL HYSTERECTOMY     WITH BSO   CATARACT EXTRACTION Bilateral 2018   Dr. Talbert Forest   CHOLECYSTECTOMY     EYE SURGERY Bilateral 2018   Cat Sx - Dr. Talbert Forest    FAMILY HISTORY Family History  Problem Relation Age of Onset   Myasthenia  gravis Daughter    Epilepsy Son     SOCIAL HISTORY Social History   Tobacco Use   Smoking status: Every Day    Packs/day: 0.50    Years: 60.00    Pack years: 30.00    Types: Cigarettes   Smokeless tobacco: Never  Vaping Use   Vaping Use: Never used  Substance Use Topics   Alcohol use: No   Drug use: No         OPHTHALMIC EXAM:  Base Eye Exam     Visual Acuity (Snellen - Linear)       Right Left   Dist [place] 20/60 -2 20/50 -2   Dist ph [place] NI NI         Tonometry (Tonopen, 9:01 AM)       Right Left   Pressure 9 10         Pupils       Dark Light Shape React APD   Right 2 2 Round Minimal None   Left 2 2 Round Minimal None         Visual Fields (Counting fingers)       Left Right    Full Full         Extraocular Movement       Right Left    Full, Ortho Full, Ortho         Neuro/Psych     Oriented x3: Yes   Mood/Affect: Normal         Dilation     Both eyes: 1.0% Mydriacyl, 2.5% Phenylephrine @ 9:01 AM           Slit Lamp and Fundus Exam     Slit Lamp Exam       Right Left   Lids/Lashes Dermatochalasis - upper lid, mild Meibomian gland dysfunction Dermatochalasis - upper lid, mild Meibomian gland dysfunction   Conjunctiva/Sclera White and quiet White and quiet   Cornea Mild arcus, well healed temporal cataract wounds, 1+ inferior Punctate epithelial erosions, trace Debris in tear film Mild arcus, well healed temporal cataract wounds, 1-2+ inferior Punctate epithelial erosions   Anterior Chamber Deep and quiet Deep and quiet   Iris Round and dilated Round and moderately dilated to 5.49m   Lens PC IOL in good position, trace Posterior capsular opacification PC IOL in good position   Vitreous Vitreous syneresis Vitreous syneresis         Fundus Exam       Right Left   Disc Pink and Sharp, Compact, 360 PPA Pink and Sharp, Compact, temporal PPA   C/D Ratio 0.4 0.3   Macula Flat, Blunted foveal reflex, Drusen, RPE mottling,  clumping and atrophy -- GA, No heme or edema Flat, Blunted foveal reflex, Drusen, RPE mottling, clumping and atrophy -- GA, focal linear IRH inferior macula -- resolved, No edema   Vessels Mild attenuated, Tortuous attenuated, Tortuous   Periphery Attached,  mild reticular degeneration, No heme  Attached, reticular degeneration, No heme            Refraction     Wearing Rx       Sphere   Right None   Left None            IMAGING AND PROCEDURES  Imaging and Procedures for @TODAY @  OCT, Retina - OU - Both Eyes       Right Eye Quality was good. Central Foveal Thickness: 369. Progression has been stable. Findings include abnormal foveal contour, no IRF, no SRF, outer retinal atrophy, retinal drusen , epiretinal membrane, macular pucker (persistent central ORA/GA).   Left Eye Quality was good. Central Foveal Thickness: 315. Progression has been stable. Findings include no IRF, abnormal foveal contour, retinal drusen , no SRF, epiretinal membrane, outer retinal atrophy.   Notes *Images captured and stored on drive  Diagnosis / Impression:  non-exu ARMD with GA OU -- stable  Clinical management:  See below  Abbreviations: NFP - Normal foveal profile. CME - cystoid macular edema. PED - pigment epithelial detachment. IRF - intraretinal fluid. SRF - subretinal fluid. EZ - ellipsoid zone. ERM - epiretinal membrane. ORA - outer retinal atrophy. ORT - outer retinal tubulation. SRHM - subretinal hyper-reflective material            ASSESSMENT/PLAN:    ICD-10-CM   1. Advanced atrophic nonexudative age-related macular degeneration of both eyes with subfoveal involvement  H35.3134     2. Retinal edema  H35.81 OCT, Retina - OU - Both Eyes    3. Essential hypertension  I10     4. Hypertensive retinopathy of both eyes  H35.033     5. Pseudophakia of both eyes  Z96.1       1,2. Age related macular degeneration, non-exudative, both eyes  - advanced atrophic stage w/ +ORA  and GA OU  - focal linear IRH inferior macula OS noted on exam, 01.28.22 -- resolved  - no fluid or edema on OCT or exam  - BCVA 20/60 OD; stable at 20/50 OS  - OCT stable OU  - Recommend amsler grid monitoring  - f/u 4 months, sooner prn -- DFE, OCT  3,4. Hypertensive retinopathy OU  - discussed importance of tight BP control  - monitor  5. Pseudophakia OU  - s/p CE/IOL OU (Dr. Talbert Forest, 2018)  - IOLs in good position, doing well  - monitor  Ophthalmic Meds Ordered this visit:  No orders of the defined types were placed in this encounter.  This document serves as a record of services personally performed by Gardiner Sleeper, MD, PhD. It was created on their behalf by San Jetty. Owens Shark, OA an ophthalmic technician. The creation of this record is the provider's dictation and/or activities during the visit.    Electronically signed by: San Jetty. [place], [place] 08.24.2022 4:20 PM   Gardiner Sleeper, M.D., Ph.D. Diseases & Surgery of the Retina and Vitreous [facility]  I have reviewed the above documentation for accuracy and completeness, and I agree with the above. Gardiner Sleeper, M.D., Ph.D. 03/18/21 4:21 PM   Abbreviations: M myopia (nearsighted); A astigmatism; H hyperopia (farsighted); P presbyopia; Mrx spectacle prescription;  CTL contact lenses; OD right eye; OS left eye; OU both eyes  XT exotropia; ET esotropia; PEK punctate epithelial keratitis; PEE punctate epithelial erosions; DES dry eye syndrome; MGD meibomian gland dysfunction; ATs artificial tears; PFAT's preservative free artificial tears; [place] nuclear  sclerotic cataract; PSC posterior subcapsular cataract; ERM epi-retinal membrane; PVD posterior vitreous detachment; RD retinal detachment; DM diabetes mellitus; DR diabetic retinopathy; NPDR non-proliferative diabetic retinopathy; PDR proliferative diabetic retinopathy; CSME clinically significant macular edema; DME diabetic macular edema; dbh dot blot  hemorrhages; CWS cotton wool spot; POAG primary open angle glaucoma; C/D cup-to-disc ratio; HVF humphrey visual field; GVF goldmann visual field; OCT optical coherence tomography; IOP intraocular pressure; BRVO Branch retinal vein occlusion; CRVO central retinal vein occlusion; CRAO central retinal artery occlusion; BRAO branch retinal artery occlusion; RT retinal tear; SB scleral buckle; PPV pars plana vitrectomy; VH Vitreous hemorrhage; PRP panretinal laser photocoagulation; IVK intravitreal kenalog; VMT vitreomacular traction; MH Macular hole;  NVD neovascularization of the disc; NVE neovascularization elsewhere; AREDS age related eye disease study; ARMD age related macular degeneration; POAG primary open angle glaucoma; EBMD epithelial/anterior basement membrane dystrophy; ACIOL anterior chamber intraocular lens; IOL intraocular lens; PCIOL posterior chamber intraocular lens; Phaco/IOL phacoemulsification with intraocular lens placement; [place] photorefractive keratectomy; LASIK laser assisted in situ keratomileusis; HTN hypertension; DM diabetes mellitus; COPD chronic obstructive pulmonary disease.

## 2021-03-13 ENCOUNTER — Other Ambulatory Visit: Payer: Self-pay

## 2021-03-13 ENCOUNTER — Encounter (INDEPENDENT_AMBULATORY_CARE_PROVIDER_SITE_OTHER): Payer: Self-pay | Admitting: Ophthalmology

## 2021-03-13 ENCOUNTER — Ambulatory Visit (INDEPENDENT_AMBULATORY_CARE_PROVIDER_SITE_OTHER): Payer: Medicare PPO | Admitting: Ophthalmology

## 2021-03-13 DIAGNOSIS — I10 Essential (primary) hypertension: Secondary | ICD-10-CM | POA: Diagnosis not present

## 2021-03-13 DIAGNOSIS — H35033 Hypertensive retinopathy, bilateral: Secondary | ICD-10-CM | POA: Diagnosis not present

## 2021-03-13 DIAGNOSIS — H353134 Nonexudative age-related macular degeneration, bilateral, advanced atrophic with subfoveal involvement: Secondary | ICD-10-CM

## 2021-03-13 DIAGNOSIS — Z961 Presence of intraocular lens: Secondary | ICD-10-CM

## 2021-03-13 DIAGNOSIS — H3581 Retinal edema: Secondary | ICD-10-CM | POA: Diagnosis not present

## 2021-03-18 ENCOUNTER — Encounter (INDEPENDENT_AMBULATORY_CARE_PROVIDER_SITE_OTHER): Payer: Self-pay | Admitting: Ophthalmology

## 2021-03-21 ENCOUNTER — Ambulatory Visit (HOSPITAL_COMMUNITY)
Admission: RE | Admit: 2021-03-21 | Discharge: 2021-03-21 | Disposition: A | Discharge status: Home / Self Care | Payer: Medicare PPO | Source: Ambulatory Visit | Attending: Vascular Surgery | Admitting: Vascular Surgery

## 2021-03-21 ENCOUNTER — Other Ambulatory Visit: Payer: Self-pay

## 2021-03-21 DIAGNOSIS — I714 Abdominal aortic aneurysm, without rupture, unspecified: Secondary | ICD-10-CM

## 2021-03-21 DIAGNOSIS — Z9049 Acquired absence of other specified parts of digestive tract: Secondary | ICD-10-CM | POA: Diagnosis not present

## 2021-03-21 LAB — POCT I-STAT CREATININE: Creatinine, Ser: 1 mg/dL (ref 0.44–1.00)

## 2021-03-21 MED ORDER — IOHEXOL 350 MG/ML SOLN
100.0000 mL | Freq: Once | INTRAVENOUS | Status: AC | PRN
  Administered 2021-03-21: 100 mL via INTRAVENOUS

## 2021-03-25 ENCOUNTER — Other Ambulatory Visit: Payer: Self-pay

## 2021-03-25 ENCOUNTER — Encounter: Payer: Self-pay | Admitting: Vascular Surgery

## 2021-03-25 ENCOUNTER — Ambulatory Visit: Payer: Medicare PPO | Admitting: Vascular Surgery

## 2021-03-25 VITALS — BP 137/64 | HR 66 | Temp 97.6°F | Resp 16 | Ht 61.0 in | Wt 111.0 lb

## 2021-03-25 DIAGNOSIS — I714 Abdominal aortic aneurysm, without rupture, unspecified: Secondary | ICD-10-CM

## 2021-03-25 NOTE — Progress Notes (Signed)
Patient name: Chelsey Young MRN: [id number] DOB: May 19, 1941 Sex: female  REASON FOR VISIT: 1 year follow-up with CTA  s/p endograft for treatment of saccular aneurysm of the infrarenal abdominal aorta  HPI: Chelsey Young is a 80 y.o. female who presents for one year follow-up after stent graft repair of a saccular aneurysm of her infrarenal abdominal aorta.  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    No complaints or concerns today.  She did get a CTA prior to the visit.  Past Medical History:  Diagnosis Date   Arthritis    Cancer ([place])    CERVICAL   COPD (chronic obstructive pulmonary disease) ([facility])    Hypercholesteremia    Hypertension    Hypertensive retinopathy    OU   Hypokalemia    Macular degeneration    Non-exu OU   Stroke [facility])    TIA   Thoracic aortic aneurysm without rupture ([place]) 10/29/2016   PER CT CHEST   Vitamin D deficiency     Past Surgical History:  Procedure Laterality Date   ABDOMINAL AORTIC ENDOVASCULAR STENT GRAFT N/A 02/29/2020   Procedure: ABDOMINAL AORTIC ENDOVASCULAR STENT GRAFT;  Surgeon: Marty Heck, MD;  Location: [facility] OR;  Service: Vascular;  Laterality: N/A;   ABDOMINAL HYSTERECTOMY     WITH BSO   CATARACT EXTRACTION Bilateral 2018   Dr. Talbert Forest   CHOLECYSTECTOMY     EYE SURGERY Bilateral 2018   Cat Sx - Dr. Talbert Forest    Family History  Problem Relation Age of Onset   Myasthenia gravis Daughter    Epilepsy Son     SOCIAL HISTORY: Social History   Tobacco Use   Smoking status: Every Day    Packs/day: 0.50    Years: 60.00    Pack years: 30.00    Types: Cigarettes   Smokeless tobacco: Never  Substance Use Topics   Alcohol use: No    No Known Allergies  Current Outpatient Medications  Medication Sig Dispense Refill   albuterol (PROVENTIL) (2.5  MG/3ML) 0.083% nebulizer solution Take 2.5 mg by nebulization every 6 (six) hours as needed for wheezing or shortness of breath.     aspirin 81 MG tablet Take 81 mg by mouth daily.     calcitonin, salmon, (MIACALCIN/FORTICAL) 200 UNIT/ACT nasal spray SMARTSIG:Both Nares     calcium carbonate (OSCAL) 1500 (600 Ca) MG TABS tablet 1 tablet with meals     Cholecalciferol (VITAMIN D3) 5000 units CAPS Take 5,000 Units by mouth daily.      fluticasone furoate-vilanterol (BREO ELLIPTA) 100-25 MCG/INH AEPB INHALE 1 PUFF ONCE DAILY     Multiple Vitamins-Minerals (PRESERVISION AREDS 2+MULTI VIT PO) Take 1 capsule by mouth in the morning and at bedtime.     omeprazole (PRILOSEC) 20 MG capsule 1 capsule 30 minutes before morning meal     Potassium 99 MG TABS Take 2 tablets by mouth daily.     potassium chloride (KLOR-CON) 10 MEQ tablet 1 tablet     simvastatin (ZOCOR) 40 MG tablet Take 40 mg by mouth every evening.      TRELEGY ELLIPTA 100-62.5-25 MCG/INH AEPB Inhale 1 puff into the lungs daily.     triamterene-hydrochlorothiazide (DYAZIDE) 37.5-25 MG capsule Take 1 capsule by mouth daily.     valACYclovir (VALTREX) 1000 MG tablet Take by mouth.  No current facility-administered medications for this visit.    REVIEW OF SYSTEMS:  '[X]'$  denotes positive finding, '[ ]'$  denotes negative finding Cardiac  Comments:  Chest pain or chest pressure:    Shortness of breath upon exertion:    Short of breath when lying flat:    Irregular heart rhythm:        Vascular    Pain in calf, thigh, or hip brought on by ambulation:    Pain in feet at night that wakes you up from your sleep:     Blood clot in your veins:    Leg swelling:         Pulmonary    Oxygen at home:    Productive cough:     Wheezing:         Neurologic    Sudden weakness in arms or legs:     Sudden numbness in arms or legs:     Sudden onset of difficulty speaking or slurred speech:    Temporary loss of vision in one eye:     Problems  with dizziness:         Gastrointestinal    Blood in stool:     Vomited blood:         Genitourinary    Burning when urinating:     Blood in urine:        Psychiatric    Major depression:         Hematologic    Bleeding problems:    Problems with blood clotting too easily:        Skin    Rashes or ulcers:        Constitutional    Fever or chills:      PHYSICAL EXAM: Vitals:   03/25/21 1334  BP: 137/64  Pulse: 66  Resp: 16  Temp: 97.6 F (36.4 C)  TempSrc: Temporal  SpO2: 91%  Weight: 111 lb (50.3 kg)  Height: '5\' 1"'$  (1.549 m)    GENERAL: The patient is a well-nourished female, in no acute distress. The vital signs are documented above. CARDIAC: There is a regular rate and rhythm.  VASCULAR:  Palpable femoral pulses bilaterally Palpable left dorsalis pedis pulse PULMONARY: No respiratory distress. ABDOMEN: Soft and non-tender. MUSCULOSKELETAL: There are no major deformities or cyanosis. NEUROLOGIC: No focal weakness or paresthesias are detected.   DATA:   CTA abdomen pelvis 03/22/2021 shows good position of the aortic cuffs with near complete resolution of the saccular aneurysm.  She also has an enlarging right renal cyst in the lower pole of the kidney with some mixed attenuation.  Assessment/Plan:  80 year old female status post endovascular treatment of her saccular aneurysm in the infrarenal abdominal aorta with aortic cuffs x2 on 02/29/2020.  Discussed that her CTA shows these cuts are in good position with near complete resolution of the saccular aneurysm.  Discussed I will see her again in 1 year with EVAR ultrasound here in the office.  Also discussed incidental finding of enlarging cyst in the lower pole of the right kidney with some mixed attenuation on CT.  Discussed I will refer her to urology for further evaluation.  Our office will make the referral today.  Marty Heck, MD [facility] Office:  [phone]

## 2021-03-26 DIAGNOSIS — E559 Vitamin D deficiency, unspecified: Secondary | ICD-10-CM | POA: Diagnosis not present

## 2021-05-09 DIAGNOSIS — N281 Cyst of kidney, acquired: Secondary | ICD-10-CM | POA: Diagnosis not present

## 2021-06-10 DIAGNOSIS — E7800 Pure hypercholesterolemia, unspecified: Secondary | ICD-10-CM | POA: Diagnosis not present

## 2021-06-10 DIAGNOSIS — E559 Vitamin D deficiency, unspecified: Secondary | ICD-10-CM | POA: Diagnosis not present

## 2021-06-10 DIAGNOSIS — Z0000 Encounter for general adult medical examination without abnormal findings: Secondary | ICD-10-CM | POA: Diagnosis not present

## 2021-06-17 DIAGNOSIS — M5136 Other intervertebral disc degeneration, lumbar region: Secondary | ICD-10-CM | POA: Diagnosis not present

## 2021-06-17 DIAGNOSIS — Z8673 Personal history of transient ischemic attack (TIA), and cerebral infarction without residual deficits: Secondary | ICD-10-CM | POA: Diagnosis not present

## 2021-06-17 DIAGNOSIS — E559 Vitamin D deficiency, unspecified: Secondary | ICD-10-CM | POA: Diagnosis not present

## 2021-06-17 DIAGNOSIS — I712 Thoracic aortic aneurysm, without rupture, unspecified: Secondary | ICD-10-CM | POA: Diagnosis not present

## 2021-06-17 DIAGNOSIS — J449 Chronic obstructive pulmonary disease, unspecified: Secondary | ICD-10-CM | POA: Diagnosis not present

## 2021-06-17 DIAGNOSIS — E7800 Pure hypercholesterolemia, unspecified: Secondary | ICD-10-CM | POA: Diagnosis not present

## 2021-07-16 NOTE — Progress Notes (Signed)
[facility] Note  07/17/2021     CHIEF COMPLAINT Patient presents for Retina Follow Up  HISTORY OF PRESENT ILLNESS: Chelsey Young is a 80 y.o. female who presents to the clinic today for:   HPI     Retina Follow Up   Patient presents with  Dry AMD.  In both eyes.  Duration of 4 months.  Since onset it is gradually worsening.  I, the attending physician,  performed the HPI with the patient and updated documentation appropriately.        Comments   4 month follow up ARMD OU- Thinks vision is worse since last visit.  She has stopped driving.  Eyes itch a lot.  Uses AT's, doesn't help.      Last edited by Bernarda Caffey, MD on 07/17/2021 10:31 AM.    Pt states vision has gotten worse since she was here last, she has had to stop driving, she states she cannot read and has a hard time dialing a phone by herself  Referring physician: Deland Pretty, MD [place],  [place] [address]  HISTORICAL INFORMATION:   Selected notes from the [facility] Referred by Dr. Marin Comment for concern of macular degeneration OU LEE: 12/22/2019 BCVA: 20/60; 20/60   CURRENT MEDICATIONS: No current outpatient medications on file. (Ophthalmic Drugs)   No current facility-administered medications for this visit. (Ophthalmic Drugs)   Current Outpatient Medications (Other)  Medication Sig   albuterol (PROVENTIL) (2.5 MG/3ML) 0.083% nebulizer solution Take 2.5 mg by nebulization every 6 (six) hours as needed for wheezing or shortness of breath.   aspirin 81 MG tablet Take 81 mg by mouth daily.   calcitonin, salmon, (MIACALCIN/FORTICAL) 200 UNIT/ACT nasal spray SMARTSIG:Both Nares   calcium carbonate (OSCAL) 1500 (600 Ca) MG TABS tablet 1 tablet with meals   Cholecalciferol (VITAMIN D3) 5000 units CAPS Take 5,000 Units by mouth daily.    fluticasone furoate-vilanterol (BREO ELLIPTA) 100-25 MCG/INH AEPB INHALE 1 PUFF ONCE DAILY   Multiple  Vitamins-Minerals (PRESERVISION AREDS 2+MULTI VIT PO) Take 1 capsule by mouth in the morning and at bedtime.   omeprazole (PRILOSEC) 20 MG capsule 1 capsule 30 minutes before morning meal   Potassium 99 MG TABS Take 2 tablets by mouth daily.   potassium chloride (KLOR-CON) 10 MEQ tablet 1 tablet   simvastatin (ZOCOR) 40 MG tablet Take 40 mg by mouth every evening.    TRELEGY ELLIPTA 100-62.5-25 MCG/INH AEPB Inhale 1 puff into the lungs daily.   triamterene-hydrochlorothiazide (DYAZIDE) 37.5-25 MG capsule Take 1 capsule by mouth daily.   valACYclovir (VALTREX) 1000 MG tablet Take by mouth.   No current facility-administered medications for this visit. (Other)   REVIEW OF SYSTEMS: ROS   Positive for: Neurological, Musculoskeletal, Eyes, Respiratory Negative for: Constitutional, Gastrointestinal, Skin, Genitourinary, HENT, Endocrine, Cardiovascular, Psychiatric, Allergic/Imm, Heme/Lymph Last edited by Leonie Douglas, COA on 07/17/2021  8:51 AM.     ALLERGIES No Known Allergies  PAST MEDICAL HISTORY Past Medical History:  Diagnosis Date   Arthritis    Cancer ([place])    CERVICAL   COPD (chronic obstructive pulmonary disease) ([place])    Hypercholesteremia    Hypertension    Hypertensive retinopathy    OU   Hypokalemia    Macular degeneration    Non-exu OU   Stroke [facility])    TIA   Thoracic aortic aneurysm without rupture 10/29/2016   PER CT CHEST   Vitamin D deficiency  Past Surgical History:  Procedure Laterality Date   ABDOMINAL AORTIC ENDOVASCULAR STENT GRAFT N/A 02/29/2020   Procedure: ABDOMINAL AORTIC ENDOVASCULAR STENT GRAFT;  Surgeon: Marty Heck, MD;  Location: [facility] OR;  Service: Vascular;  Laterality: N/A;   ABDOMINAL HYSTERECTOMY     WITH BSO   CATARACT EXTRACTION Bilateral 2018   Dr. Talbert Forest   CHOLECYSTECTOMY     EYE SURGERY Bilateral 2018   Cat Sx - Dr. Talbert Forest   FAMILY HISTORY Family History  Problem Relation Age of Onset   Myasthenia gravis Daughter     Epilepsy Son    SOCIAL HISTORY Social History   Tobacco Use   Smoking status: Every Day    Packs/day: 0.50    Years: 60.00    Pack years: 30.00    Types: Cigarettes   Smokeless tobacco: Never  Vaping Use   Vaping Use: Never used  Substance Use Topics   Alcohol use: No   Drug use: No       OPHTHALMIC EXAM:  Base Eye Exam     Visual Acuity (Snellen - Linear)       Right Left   Dist [place] 20/80 20/60 -2   Dist ph  NI NI         Tonometry (Tonopen, 9:04 AM)       Right Left   Pressure 14 15         Pupils       Dark Light Shape React APD   Right 3 2 Round Brisk None   Left 3 2 Round Brisk None         Visual Fields (Counting fingers)       Left Right    Full Full         Extraocular Movement       Right Left    Full Full         Neuro/Psych     Oriented x3: Yes   Mood/Affect: Normal         Dilation     Both eyes: 1.0% Mydriacyl, 2.5% Phenylephrine @ 9:04 AM           Slit Lamp and Fundus Exam     Slit Lamp Exam       Right Left   Lids/Lashes Dermatochalasis - upper lid, mild Meibomian gland dysfunction Dermatochalasis - upper lid, mild Meibomian gland dysfunction   Conjunctiva/Sclera White and quiet White and quiet   Cornea Mild arcus, well healed temporal cataract wounds Mild arcus, well healed temporal cataract wounds   Anterior Chamber Deep and quiet Deep and quiet   Iris Round and dilated Round and moderately dilated to 5.87mm   Lens PC IOL in good position, trace Posterior capsular opacification PC IOL in good position   Anterior Vitreous Vitreous syneresis Vitreous syneresis         Fundus Exam       Right Left   Disc Pink and Sharp, Compact, 360 PPA Pink and Sharp, Compact, temporal PPA   C/D Ratio 0.4 0.3   Macula Flat, Blunted foveal reflex, Drusen, RPE mottling, clumping and atrophy -- GA, No heme or edema Flat, Blunted foveal reflex, Drusen, RPE mottling, clumping and atrophy -- GA, No edema   Vessels  attenuated, mild tortuousity attenuated, mild tortuousity   Periphery Attached, mild reticular degeneration, No heme Attached, reticular degeneration, No heme            Refraction     Manifest Refraction  Sphere Cylinder Axis Dist VA   Right -0.75 +1.00 015 20/50-2   Left [place]   20/50-2           IMAGING AND PROCEDURES  Imaging and Procedures for @TODAY @  OCT, Retina - OU - Both Eyes       Right Eye Quality was good. Central Foveal Thickness: 369. Progression has worsened. Findings include abnormal foveal contour, no IRF, no SRF, outer retinal atrophy, retinal drusen , epiretinal membrane, macular pucker (Mild progression of ORA/GA).   Left Eye Quality was good. Central Foveal Thickness: 313. Progression has worsened. Findings include no IRF, abnormal foveal contour, retinal drusen , no SRF, epiretinal membrane, outer retinal atrophy (Mild progression of ORA/GA).   Notes *Images captured and stored on drive  Diagnosis / Impression:  non-exu ARMD with mild progression of ORA/GA OU    Clinical management:  See below  Abbreviations: NFP - Normal foveal profile. CME - cystoid macular edema. PED - pigment epithelial detachment. IRF - intraretinal fluid. SRF - subretinal fluid. EZ - ellipsoid zone. ERM - epiretinal membrane. ORA - outer retinal atrophy. ORT - outer retinal tubulation. SRHM - subretinal hyper-reflective material            ASSESSMENT/PLAN:    ICD-10-CM   1. Advanced atrophic nonexudative age-related macular degeneration of both eyes with subfoveal involvement  H35.3134 OCT, Retina - OU - Both Eyes    2. Essential hypertension  I10     3. Hypertensive retinopathy of both eyes  H35.033     4. Pseudophakia of both eyes  Z96.1      1. Age related macular degeneration, non-exudative, both eyes  - advanced atrophic stage w/ +ORA and GA OU  - focal linear IRH inferior macula OS noted on exam, 01.28.22 -- stably resolved, no heme  - no fluid  or edema on OCT or exam  - BCVA 20/80 OD; 20/60 OS -- both decreased  - OCT shows mild progression of ORA/GA OU  - pt reports continued smoking of 1/2 ppd -- discussed smoking risks related to ARMD and advised cessation of smoking  - pt reports that she has stopped driving since last visit  - Recommend amsler grid monitoring  - f/u 4-6 months, sooner prn -- DFE, OCT  2,3. Hypertensive retinopathy OU  - discussed importance of tight BP control  - monitor  4. Pseudophakia OU  - s/p CE/IOL OU (Dr. Talbert Forest, 2018)  - IOLs in good position, doing well  - monitor  Ophthalmic Meds Ordered this visit:  No orders of the defined types were placed in this encounter.   This document serves as a record of services personally performed by Gardiner Sleeper, MD, PhD. It was created on their behalf by San Jetty. Owens Shark, OA an ophthalmic technician. The creation of this record is the provider's dictation and/or activities during the visit.    Electronically signed by: San Jetty. Owens Shark, [place] 12.28.2022 10:35 AM  Gardiner Sleeper, M.D., Ph.D. Diseases & Surgery of the Retina and Vitreous [facility]  I have reviewed the above documentation for accuracy and completeness, and I agree with the above. Gardiner Sleeper, M.D., Ph.D. 07/17/21 10:35 AM  Abbreviations: M myopia (nearsighted); A astigmatism; H hyperopia (farsighted); P presbyopia; Mrx spectacle prescription;  CTL contact lenses; OD right eye; OS left eye; OU both eyes  XT exotropia; ET esotropia; PEK punctate epithelial keratitis; PEE punctate epithelial erosions; DES dry eye syndrome; MGD meibomian gland dysfunction;  ATs artificial tears; PFAT's preservative free artificial tears; [place] nuclear sclerotic cataract; PSC posterior subcapsular cataract; ERM epi-retinal membrane; PVD posterior vitreous detachment; RD retinal detachment; DM diabetes mellitus; DR diabetic retinopathy; NPDR non-proliferative diabetic retinopathy; PDR  proliferative diabetic retinopathy; CSME clinically significant macular edema; DME diabetic macular edema; dbh dot blot hemorrhages; CWS cotton wool spot; POAG primary open angle glaucoma; C/D cup-to-disc ratio; HVF humphrey visual field; GVF goldmann visual field; OCT optical coherence tomography; IOP intraocular pressure; BRVO Branch retinal vein occlusion; CRVO central retinal vein occlusion; CRAO central retinal artery occlusion; BRAO branch retinal artery occlusion; RT retinal tear; SB scleral buckle; PPV pars plana vitrectomy; VH Vitreous hemorrhage; PRP panretinal laser photocoagulation; IVK intravitreal kenalog; VMT vitreomacular traction; MH Macular hole;  NVD neovascularization of the disc; NVE neovascularization elsewhere; AREDS age related eye disease study; ARMD age related macular degeneration; POAG primary open angle glaucoma; EBMD epithelial/anterior basement membrane dystrophy; ACIOL anterior chamber intraocular lens; IOL intraocular lens; PCIOL posterior chamber intraocular lens; Phaco/IOL phacoemulsification with intraocular lens placement; [place] photorefractive keratectomy; LASIK laser assisted in situ keratomileusis; HTN hypertension; DM diabetes mellitus; COPD chronic obstructive pulmonary disease.

## 2021-07-17 ENCOUNTER — Encounter (INDEPENDENT_AMBULATORY_CARE_PROVIDER_SITE_OTHER): Payer: Self-pay | Admitting: Ophthalmology

## 2021-07-17 ENCOUNTER — Other Ambulatory Visit: Payer: Self-pay

## 2021-07-17 ENCOUNTER — Ambulatory Visit (INDEPENDENT_AMBULATORY_CARE_PROVIDER_SITE_OTHER): Payer: Medicare PPO | Admitting: Ophthalmology

## 2021-07-17 DIAGNOSIS — H35033 Hypertensive retinopathy, bilateral: Secondary | ICD-10-CM

## 2021-07-17 DIAGNOSIS — Z961 Presence of intraocular lens: Secondary | ICD-10-CM

## 2021-07-17 DIAGNOSIS — I10 Essential (primary) hypertension: Secondary | ICD-10-CM | POA: Diagnosis not present

## 2021-07-17 DIAGNOSIS — H353134 Nonexudative age-related macular degeneration, bilateral, advanced atrophic with subfoveal involvement: Secondary | ICD-10-CM

## 2021-08-08 ENCOUNTER — Other Ambulatory Visit: Payer: Self-pay

## 2021-08-08 ENCOUNTER — Ambulatory Visit: Payer: Medicare HMO | Admitting: Podiatrist

## 2021-08-08 DIAGNOSIS — M79609 Pain in unspecified limb: Secondary | ICD-10-CM

## 2021-08-08 DIAGNOSIS — B351 Tinea unguium: Secondary | ICD-10-CM

## 2021-08-08 DIAGNOSIS — L84 Corns and callosities: Secondary | ICD-10-CM

## 2021-08-08 DIAGNOSIS — L602 Onychogryphosis: Secondary | ICD-10-CM

## 2021-08-08 NOTE — Patient Instructions (Signed)

## 2021-08-13 ENCOUNTER — Encounter: Payer: Self-pay | Admitting: Podiatrist

## 2021-08-13 NOTE — Progress Notes (Signed)
Chief Complaint  Patient presents with   Nail Problem    RFC Bilateral nail trim. Both great toe nails are overlapping and curving.    Callouses    Bilateral calluses, B/L great toes and ball of left foot.      HPI: Patient is 81 y.o. female who presents today for painful and incurvated toenails on both feet. The great toenails are curving lateral and pressing on the second toenails causing pain.  She also has calluses on both feet which are uncomfortable as well.    Patient Active Problem List   Diagnosis Date Noted   Abdominal aortic aneurysm (AAA) 02/29/2020   AAA (abdominal aortic aneurysm) without rupture 01/23/2020   Cancer ([place])    Vitamin D deficiency    Thoracic aortic aneurysm without rupture 10/29/2016    Current Outpatient Medications on File Prior to Visit  Medication Sig Dispense Refill   albuterol (PROVENTIL) (2.5 MG/3ML) 0.083% nebulizer solution Take 2.5 mg by nebulization every 6 (six) hours as needed for wheezing or shortness of breath.     aspirin 81 MG tablet Take 81 mg by mouth daily.     calcitonin, salmon, (MIACALCIN/FORTICAL) 200 UNIT/ACT nasal spray SMARTSIG:Both Nares     calcium carbonate (OSCAL) 1500 (600 Ca) MG TABS tablet 1 tablet with meals     Cholecalciferol (VITAMIN D3) 5000 units CAPS Take 5,000 Units by mouth daily.      fluticasone furoate-vilanterol (BREO ELLIPTA) 100-25 MCG/INH AEPB INHALE 1 PUFF ONCE DAILY     Multiple Vitamins-Minerals (PRESERVISION AREDS 2+MULTI VIT PO) Take 1 capsule by mouth in the morning and at bedtime.     omeprazole (PRILOSEC) 20 MG capsule 1 capsule 30 minutes before morning meal     Potassium 99 MG TABS Take 2 tablets by mouth daily.     potassium chloride (KLOR-CON) 10 MEQ tablet 1 tablet     simvastatin (ZOCOR) 40 MG tablet Take 40 mg by mouth every evening.      TRELEGY ELLIPTA 100-62.5-25 MCG/INH AEPB Inhale 1 puff into the lungs daily.     triamterene-hydrochlorothiazide (DYAZIDE) 37.5-25 MG capsule Take 1  capsule by mouth daily.     valACYclovir (VALTREX) 1000 MG tablet Take by mouth.     No current facility-administered medications on file prior to visit.    No Known Allergies  Review of Systems No fevers, chills, nausea, muscle aches, no difficulty breathing, no calf pain, no chest pain or shortness of breath.   Physical Exam  GENERAL APPEARANCE: Alert, conversant. Appropriately groomed. No acute distress.   VASCULAR: Pedal pulses palpable DP and PT bilateral.  Capillary refill time is immediate to all digits,  Proximal to distal cooling it warm to warm.  Digital perfusion adequate.   NEUROLOGIC: sensation is intact to 5.07 monofilament at 5/5 sites bilateral.  Light touch is intact bilateral, vibratory sensation intact bilateral  MUSCULOSKELETAL: acceptable muscle strength, tone and stability bilateral.   Hammertoe contracture deformities noted with swan neck deformity right second toe.  No pain, crepitus or limitation noted with foot and ankle range of motion bilateral.   DERMATOLOGIC: skin is warm, supple, and dry.  No open lesions noted.  No rash, no pre ulcerative lesions. Digital nails are thick, discolored, dystrophic, brittle with subungual debris present and clinically mycotic x 10.  Bilateral hallux nails curve laterally and are pressing on the second digits.pincer callus is present bilateral halluces as well as a callus submetatarsal 3 left foot.    Assessment  ICD-10-CM   1. Pain due to onychomycosis of nail  B35.1    M79.609     2. Onychogryphosis  L60.2     3. Callus of foot  L84        Plan  Recommended debridement of the toenails today and this was carried out via manual and mechanical means without complication.  I also pared her calluses today as a courtesy.  She will return in 3 months or as needed for continued care. If any concerns arise in the mean time she will call.

## 2021-11-06 NOTE — Progress Notes (Signed)
?[facility] Note ? ?11/13/2021 ? ?  ? ?CHIEF COMPLAINT ?Patient presents for Retina Follow Up ? ?HISTORY OF PRESENT ILLNESS: ?Chelsey Young is a 81 y.o. female who presents to the clinic today for:  ? ?HPI   ? ? Retina Follow Up   ?Patient presents with  Dry AMD.  In both eyes.  This started 4 months ago.  I, the attending physician,  performed the HPI with the patient and updated documentation appropriately. ? ?  ?  ? ? Comments   ?Patient here for 4 months retina follow up for non exu ARMD OU. Patient states vision doing the same. No eye pain.  ? ?  ?  ?Last edited by Bernarda Caffey, MD on 11/13/2021  1:15 PM.  ?  ?Pt states vision is about the same, she is taking AREDS2 as well as vit A and E ? ?Referring physician: ?Deland Pretty, MD ?[place],  [place] [address] ? ?HISTORICAL INFORMATION:  ? ?Selected notes from the [facility] ?Referred by Dr. Marin Comment for concern of macular degeneration OU ?LEE: 12/22/2019 ?BCVA: 20/60; 20/60  ? ?CURRENT MEDICATIONS: ?No current outpatient medications on file. (Ophthalmic Drugs)  ? ?No current facility-administered medications for this visit. (Ophthalmic Drugs)  ? ?Current Outpatient Medications (Other)  ?Medication Sig  ? albuterol (PROVENTIL) (2.5 MG/3ML) 0.083% nebulizer solution Take 2.5 mg by nebulization every 6 (six) hours as needed for wheezing or shortness of breath.  ? aspirin 81 MG tablet Take 81 mg by mouth daily.  ? calcitonin, salmon, (MIACALCIN/FORTICAL) 200 UNIT/ACT nasal spray SMARTSIG:Both Nares  ? calcium carbonate (OSCAL) 1500 (600 Ca) MG TABS tablet 1 tablet with meals  ? Cholecalciferol (VITAMIN D3) 5000 units CAPS Take 5,000 Units by mouth daily.   ? fluticasone furoate-vilanterol (BREO ELLIPTA) 100-25 MCG/INH AEPB INHALE 1 PUFF ONCE DAILY  ? Multiple Vitamins-Minerals (PRESERVISION AREDS 2+MULTI VIT PO) Take 1 capsule by mouth in the morning and at bedtime.  ? omeprazole (PRILOSEC) 20 MG  capsule 1 capsule 30 minutes before morning meal  ? Potassium 99 MG TABS Take 2 tablets by mouth daily.  ? potassium chloride (KLOR-CON) 10 MEQ tablet 1 tablet  ? simvastatin (ZOCOR) 40 MG tablet Take 40 mg by mouth every evening.   ? TRELEGY ELLIPTA 100-62.5-25 MCG/INH AEPB Inhale 1 puff into the lungs daily.  ? triamterene-hydrochlorothiazide (DYAZIDE) 37.5-25 MG capsule Take 1 capsule by mouth daily.  ? valACYclovir (VALTREX) 1000 MG tablet Take by mouth.  ? ?No current facility-administered medications for this visit. (Other)  ? ?REVIEW OF SYSTEMS: ?ROS   ?Positive for: Neurological, Musculoskeletal, Eyes, Respiratory ?Negative for: Constitutional, Gastrointestinal, Skin, Genitourinary, HENT, Endocrine, Cardiovascular, Psychiatric, Allergic/Imm, Heme/Lymph ?Last edited by Theodore Demark, COA on 11/13/2021  8:56 AM.  ?  ? ? ?ALLERGIES ?No Known Allergies ? ?PAST MEDICAL HISTORY ?Past Medical History:  ?Diagnosis Date  ? Arthritis   ? Cancer [facility])   ? CERVICAL  ? COPD (chronic obstructive pulmonary disease) ([place])   ? Hypercholesteremia   ? Hypertension   ? Hypertensive retinopathy   ? OU  ? Hypokalemia   ? Macular degeneration   ? Non-exu OU  ? Stroke [facility])   ? TIA  ? Thoracic aortic aneurysm without rupture ([place]) 10/29/2016  ? PER CT CHEST  ? Vitamin D deficiency   ? ?Past Surgical History:  ?Procedure Laterality Date  ? ABDOMINAL AORTIC ENDOVASCULAR STENT GRAFT N/A 02/29/2020  ? Procedure: ABDOMINAL  AORTIC ENDOVASCULAR STENT GRAFT;  Surgeon: Marty Heck, MD;  Location: [facility];  Service: Vascular;  Laterality: N/A;  ? ABDOMINAL HYSTERECTOMY    ? WITH BSO  ? CATARACT EXTRACTION Bilateral 2018  ? Dr. Talbert Forest  ? CHOLECYSTECTOMY    ? EYE SURGERY Bilateral 2018  ? Cat Sx - Dr. Talbert Forest  ? ?FAMILY HISTORY ?Family History  ?Problem Relation Age of Onset  ? Myasthenia gravis Daughter   ? Epilepsy Son   ? ?SOCIAL HISTORY ?Social History  ? ?Tobacco Use  ? Smoking status: Every Day  ?  Packs/day: 0.50  ?  Years: 60.00   ?  Pack years: 30.00  ?  Types: Cigarettes  ? Smokeless tobacco: Never  ?Vaping Use  ? Vaping Use: Never used  ?Substance Use Topics  ? Alcohol use: No  ? Drug use: No  ?  ? ?  ?OPHTHALMIC EXAM: ? ?Base Eye Exam   ? ? Visual Acuity (Snellen - Linear)   ? ?   Right Left  ? Dist [place] 20/60 20/60  ? Dist ph  NI NI  ? ?  ?  ? ? Tonometry (Tonopen, 8:54 AM)   ? ?   Right Left  ? Pressure 10 09  ? ?  ?  ? ? Pupils   ? ?   Dark Light Shape React APD  ? Right 3 2 Round Brisk None  ? Left 3 2 Round Brisk None  ? ?  ?  ? ? Visual Fields (Counting fingers)   ? ?   Left Right  ?  Full Full  ? ?  ?  ? ? Extraocular Movement   ? ?   Right Left  ?  Full, Ortho Full, Ortho  ? ?  ?  ? ? Neuro/Psych   ? ? Oriented x3: Yes  ? Mood/Affect: Normal  ? ?  ?  ? ? Dilation   ? ? Both eyes: 1.0% Mydriacyl, 2.5% Phenylephrine @ 8:53 AM  ? ?  ?  ? ?  ? ?Slit Lamp and Fundus Exam   ? ? Slit Lamp Exam   ? ?   Right Left  ? Lids/Lashes Dermatochalasis - upper lid, mild Meibomian gland dysfunction Dermatochalasis - upper lid, mild Meibomian gland dysfunction  ? Conjunctiva/Sclera White and quiet White and quiet  ? Cornea Mild arcus, well healed temporal cataract wounds, 1+ inferior Punctate epithelial erosions Mild arcus, well healed temporal cataract wounds, 1+ inferior Punctate epithelial erosions  ? Anterior Chamber Deep and quiet Deep and quiet  ? Iris Round and dilated Round and moderately dilated to 5.59m  ? Lens PC IOL in good position, trace Posterior capsular opacification PC IOL in good position  ? Anterior Vitreous Vitreous syneresis Vitreous syneresis  ? ?  ?  ? ? Fundus Exam   ? ?   Right Left  ? Disc Pink and Sharp, Compact, 360 PPA Pink and Sharp, Compact, temporal PPA  ? C/D Ratio 0.4 0.3  ? Macula Flat, Blunted foveal reflex, Drusen, RPE mottling, clumping and atrophy -- GA, No heme or edema Flat, Blunted foveal reflex, Drusen, RPE mottling, clumping and atrophy -- GA -- slightly increased, No edema  ? Vessels attenuated,  Tortuous attenuated, Tortuous  ? Periphery Attached, mild reticular degeneration, No heme Attached, reticular degeneration, No heme  ? ?  ?  ? ?  ? ?Refraction   ? ? Wearing Rx   ? ?   Sphere  ? Right None  ?  Left None  ? ?  ?  ? ?  ? ?IMAGING AND PROCEDURES  ?Imaging and Procedures for '@TODAY'$ @ ? ?OCT, Retina - OU - Both Eyes   ? ?   ?Right Eye ?Quality was good. Central Foveal Thickness: 362. Progression has been stable. Findings include abnormal foveal contour, no IRF, no SRF, outer retinal atrophy, retinal drusen , epiretinal membrane, macular pucker (Persistent ORA/GA, mild ERM and pucker -- stable).  ? ?Left Eye ?Quality was good. Central Foveal Thickness: 295. Progression has worsened. Findings include no IRF, abnormal foveal contour, retinal drusen , no SRF, epiretinal membrane, outer retinal atrophy (Mild progression of ORA/GA).  ? ?Notes ?*Images captured and stored on drive ? ?Diagnosis / Impression:  ?OD: Persistent ORA/GA, mild ERM and pucker -- stable ?OS: Mild progression of ORA/GA ? ? ?Clinical management:  ?See below ? ?Abbreviations: NFP - Normal foveal profile. CME - cystoid macular edema. PED - pigment epithelial detachment. IRF - intraretinal fluid. SRF - subretinal fluid. EZ - ellipsoid zone. ERM - epiretinal membrane. ORA - outer retinal atrophy. ORT - outer retinal tubulation. SRHM - subretinal hyper-reflective material ? ? ?  ? ?  ?  ? ?  ?ASSESSMENT/PLAN: ? ?  ICD-10-CM   ?1. Advanced atrophic nonexudative age-related macular degeneration of both eyes with subfoveal involvement  H35.3134 OCT, Retina - OU - Both Eyes  ?  ?2. Essential hypertension  I10   ?  ?3. Hypertensive retinopathy of both eyes  H35.033   ?  ?4. Pseudophakia of both eyes  Z96.1   ?  ? ?1. Age related macular degeneration, non-exudative, both eyes ? - advanced atrophic stage w/ +ORA and GA OU ? - focal linear IRH inferior macula OS noted on exam, 01.28.22 -- stably resolved, no heme ? - no fluid or edema on OCT or exam ? -  BCVA 20/60 OD - improved from 20/80; 20/60 OS -- stable ? - OCT shows OD: Persistent ORA/GA, mild ERM and pucker -- stable; OS: Mild progression of ORA/GA ? - pt reports continued smoking of 1/2 ppd -- discuss

## 2021-11-13 ENCOUNTER — Encounter (INDEPENDENT_AMBULATORY_CARE_PROVIDER_SITE_OTHER): Payer: Self-pay | Admitting: Ophthalmology

## 2021-11-13 ENCOUNTER — Ambulatory Visit (INDEPENDENT_AMBULATORY_CARE_PROVIDER_SITE_OTHER): Payer: Medicare HMO | Admitting: Ophthalmology

## 2021-11-13 DIAGNOSIS — Z961 Presence of intraocular lens: Secondary | ICD-10-CM

## 2021-11-13 DIAGNOSIS — H35033 Hypertensive retinopathy, bilateral: Secondary | ICD-10-CM

## 2021-11-13 DIAGNOSIS — I10 Essential (primary) hypertension: Secondary | ICD-10-CM | POA: Diagnosis not present

## 2021-11-13 DIAGNOSIS — H353134 Nonexudative age-related macular degeneration, bilateral, advanced atrophic with subfoveal involvement: Secondary | ICD-10-CM

## 2021-11-13 IMAGING — DX DG ABD PORTABLE 1V
1 series · 1 of 1 positions shown · non-contrast
Comparison: CT dated 01/24/2020.

CLINICAL DATA: 79-year-old female with infrarenal abdominal aortic
aneurysm.

EXAM:
PORTABLE ABDOMEN - 1 VIEW

[abdomen]
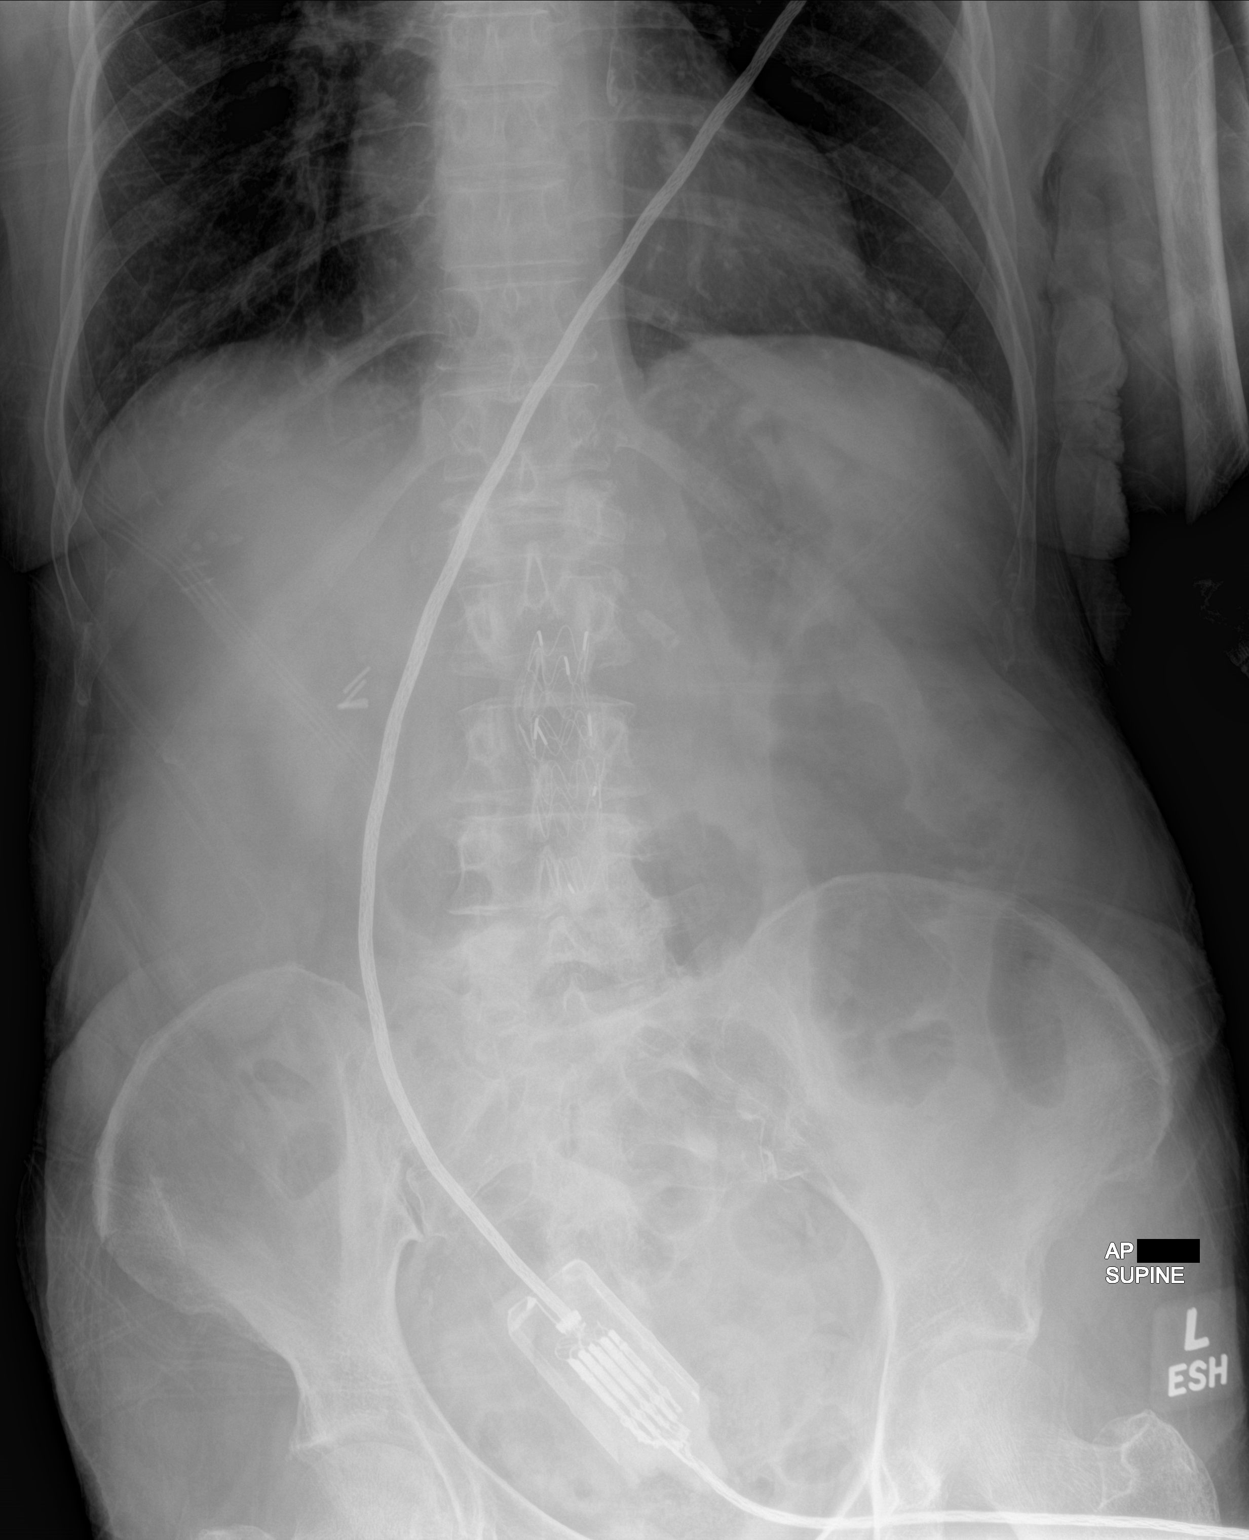

[1 of 1 positions shown; findings below may reference images not displayed]

FINDINGS: There is no bowel dilatation or evidence of obstruction. No free
air. Right upper quadrant cholecystectomy clips. A stent graft is
noted in the abdominal aorta at L2-L4. No acute osseous pathology.
Old L1 compression fracture.
IMPRESSION: 1. No evidence of bowel obstruction.
2. Endovascular stent graft repair of abdominal aorta.

## 2021-11-21 DIAGNOSIS — N281 Cyst of kidney, acquired: Secondary | ICD-10-CM | POA: Diagnosis not present

## 2021-12-11 DIAGNOSIS — E559 Vitamin D deficiency, unspecified: Secondary | ICD-10-CM | POA: Diagnosis not present

## 2021-12-11 DIAGNOSIS — I10 Essential (primary) hypertension: Secondary | ICD-10-CM | POA: Diagnosis not present

## 2021-12-11 DIAGNOSIS — E7801 Familial hypercholesterolemia: Secondary | ICD-10-CM | POA: Diagnosis not present

## 2021-12-24 ENCOUNTER — Other Ambulatory Visit: Payer: Self-pay | Admitting: Surgery

## 2021-12-24 DIAGNOSIS — I7121 Aneurysm of the ascending aorta, without rupture: Secondary | ICD-10-CM

## 2022-01-13 DIAGNOSIS — Z8673 Personal history of transient ischemic attack (TIA), and cerebral infarction without residual deficits: Secondary | ICD-10-CM | POA: Diagnosis not present

## 2022-01-13 DIAGNOSIS — E559 Vitamin D deficiency, unspecified: Secondary | ICD-10-CM | POA: Diagnosis not present

## 2022-01-13 DIAGNOSIS — N281 Cyst of kidney, acquired: Secondary | ICD-10-CM | POA: Diagnosis not present

## 2022-01-13 DIAGNOSIS — R911 Solitary pulmonary nodule: Secondary | ICD-10-CM | POA: Diagnosis not present

## 2022-01-13 DIAGNOSIS — M810 Age-related osteoporosis without current pathological fracture: Secondary | ICD-10-CM | POA: Diagnosis not present

## 2022-01-13 DIAGNOSIS — Z0000 Encounter for general adult medical examination without abnormal findings: Secondary | ICD-10-CM | POA: Diagnosis not present

## 2022-01-13 DIAGNOSIS — I10 Essential (primary) hypertension: Secondary | ICD-10-CM | POA: Diagnosis not present

## 2022-01-13 DIAGNOSIS — J449 Chronic obstructive pulmonary disease, unspecified: Secondary | ICD-10-CM | POA: Diagnosis not present

## 2022-01-13 DIAGNOSIS — E7800 Pure hypercholesterolemia, unspecified: Secondary | ICD-10-CM | POA: Diagnosis not present

## 2022-02-25 ENCOUNTER — Encounter: Payer: Self-pay | Admitting: Surgery

## 2022-02-25 ENCOUNTER — Ambulatory Visit
Admission: RE | Admit: 2022-02-25 | Discharge: 2022-02-25 | Disposition: A | Discharge status: Home / Self Care | Payer: Medicare HMO | Source: Ambulatory Visit | Attending: Surgery | Admitting: Surgery

## 2022-02-25 ENCOUNTER — Ambulatory Visit: Payer: Medicare HMO | Admitting: Surgery

## 2022-02-25 VITALS — BP 157/79 | HR 80 | Resp 20 | Ht 61.0 in | Wt 119.0 lb

## 2022-02-25 DIAGNOSIS — I2510 Atherosclerotic heart disease of native coronary artery without angina pectoris: Secondary | ICD-10-CM | POA: Diagnosis not present

## 2022-02-25 DIAGNOSIS — J9811 Atelectasis: Secondary | ICD-10-CM | POA: Diagnosis not present

## 2022-02-25 DIAGNOSIS — I7121 Aneurysm of the ascending aorta, without rupture: Secondary | ICD-10-CM | POA: Diagnosis not present

## 2022-02-25 DIAGNOSIS — I712 Thoracic aortic aneurysm, without rupture, unspecified: Secondary | ICD-10-CM | POA: Diagnosis not present

## 2022-02-25 DIAGNOSIS — J479 Bronchiectasis, uncomplicated: Secondary | ICD-10-CM | POA: Diagnosis not present

## 2022-02-25 MED ORDER — IOPAMIDOL (ISOVUE-370) INJECTION 76%
75.0000 mL | Freq: Once | INTRAVENOUS | Status: AC | PRN
  Administered 2022-02-25: 75 mL via INTRAVENOUS

## 2022-02-25 NOTE — Progress Notes (Signed)
HPI:  The patient is an 81 year old woman with a history of ongoing smoking who has been followed with a stable 3.7 cm fusiform ascending aortic aneurysm that has not changed much dating back to 2017.  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 Carlis Abbott in August 2021.  She has macular degeneration and is legally blind.  She denies any chest or back pain.  She remains as active as possible.  Current Outpatient Medications  Medication Sig Dispense Refill   albuterol (PROVENTIL) (2.5 MG/3ML) 0.083% nebulizer solution Take 2.5 mg by nebulization every 6 (six) hours as needed for wheezing or shortness of breath.     aspirin 81 MG tablet Take 81 mg by mouth daily.     calcitonin, salmon, (MIACALCIN/FORTICAL) 200 UNIT/ACT nasal spray SMARTSIG:Both Nares     calcium carbonate (OSCAL) 1500 (600 Ca) MG TABS tablet 1 tablet with meals     Cholecalciferol (VITAMIN D3) 5000 units CAPS Take 5,000 Units by mouth daily.      fluticasone furoate-vilanterol (BREO ELLIPTA) 100-25 MCG/INH AEPB INHALE 1 PUFF ONCE DAILY     Multiple Vitamins-Minerals (PRESERVISION AREDS 2+MULTI VIT PO) Take 1 capsule by mouth in the morning and at bedtime.     omeprazole (PRILOSEC) 20 MG capsule 1 capsule 30 minutes before morning meal     Potassium 99 MG TABS Take 2 tablets by mouth daily.     potassium chloride (KLOR-CON) 10 MEQ tablet 1 tablet     simvastatin (ZOCOR) 40 MG tablet Take 40 mg by mouth every evening.      TRELEGY ELLIPTA 100-62.5-25 MCG/INH AEPB Inhale 1 puff into the lungs daily.     triamterene-hydrochlorothiazide (DYAZIDE) 37.5-25 MG capsule Take 1 capsule by mouth daily.     valACYclovir (VALTREX) 1000 MG tablet Take by mouth.     No current facility-administered medications for this visit.     Physical Exam: BP (!) 157/79   Pulse 80   Resp 20   Ht '5\' 1"'$  (1.549 m)   Wt 119 lb (54 kg)   SpO2 94%   BMI 22.48 kg/m  She looks  well. Cardiac exam shows regular rate and rhythm with normal heart sounds.  There is no murmur. Lung exam revealed slight wheezing. There is no peripheral edema.  Diagnostic Tests:  Narrative & Impression  CLINICAL DATA:  81 year old female with aortic aneurysm   EXAM: CT ANGIOGRAPHY CHEST WITH CONTRAST   TECHNIQUE: Multidetector CT imaging of the chest was performed using the standard protocol during bolus administration of intravenous contrast. Multiplanar CT image reconstructions and MIPs were obtained to evaluate the vascular anatomy.   RADIATION DOSE REDUCTION: This exam was performed according to the departmental dose-optimization program which includes automated exposure control, adjustment of the mA and/or kV according to patient size and/or use of iterative reconstruction technique.   CONTRAST:  34m ISOVUE-370 IOPAMIDOL (ISOVUE-370) INJECTION 76%   COMPARISON:  03/21/2021, 03/20/2020, 01/24/2020, 01/05/2018   FINDINGS: Cardiovascular:   Heart:   No cardiomegaly. No pericardial fluid/thickening. Calcifications of the left anterior descending coronary artery. Calcifications at the mitral annulus.   Aorta:   Minimal aortic valve calcifications.   Greatest estimated diameter of the aortic annulus 23 mm on the coronal reformatted images.   Greatest estimated diameter of the sino-tubular junction, 25 mm on the coronal images.   Greatest estimated diameter of the ascending aorta on the axial images, 37 mm.  Atherosclerotic calcifications of the aortic arch. Changes of the aorta. Branch vessels are patent with a 3 vessel arch. Cervical cerebral vessels patent at the base of the neck.   No pedunculated plaque, ulcerated plaque, dissection, periaortic fluid. No wall thickening.   Pulmonary arteries:   Timing of the contrast bolus is not optimized for evaluation of pulmonary artery filling defects. Unremarkable size of the main pulmonary artery.    Mediastinum/Nodes: No mediastinal adenopathy. Unremarkable appearance of the thoracic esophagus.   Unremarkable appearance of the thoracic inlet.   Lungs/Pleura: Redemonstration of bronchiectasis and bronchial wall thickening, including some endobronchial debris within the bronchi bilaterally.   Atelectasis/scarring of the right upper lobe again noted. No confluent airspace disease.   No pneumothorax or pleural effusion.   Upper Abdomen: No acute finding of the upper abdomen.   Musculoskeletal: Unchanged compression fracture of L1. Mild degenerative changes of the visualized spine. No new acute fracture identified.   Review of the MIP images confirms the above findings.   IMPRESSION: Relatively unchanged size and configuration of the ascending aorta, with the largest estimated diameter on the current CT 3.7 cm.   Changes of bronchiectasis and bronchial wall thickening, suggesting chronic bronchitis. No evidence confluent airspace disease   Aortic atherosclerosis and single-vessel coronary artery disease. Aortic Atherosclerosis (ICD10-I70.0).   Additional ancillary findings as above.   Signed,   Dulcy Fanny. Nadene Rubins, RPVI   [facility]   [facility]     Electronically Signed   By: Corrie Mckusick D.O.   On: 02/25/2022 10:41      Impression:  Her ascending aorta is unchanged at 3.7 cm.  This is well below the surgical threshold of 5.5 cm and I do not think there is any reason to continue following this with CTA at her age.  I do not think she would be a candidate for open surgical repair.  I reviewed the CT images with the patient and her daughter and answered their questions.  I stressed the importance of continued good blood pressure control and preventing further enlargement and acute aortic dissection.  Plan:  She will continue to follow-up with her PCP and with Dr. Carlis Abbott concerning her abdominal endograft.   I will be happy to see her back if the need arises but I do not think she will have any problems with her 3.7 cm ascending aorta.  I spent 20 minutes performing this established patient evaluation and > 50% of this time was spent face to face counseling and coordinating the care of this patient's aortic aneurysm.    Gaye Pollack, MD [facility] [phone]

## 2022-03-05 ENCOUNTER — Other Ambulatory Visit: Payer: Self-pay

## 2022-03-05 DIAGNOSIS — J449 Chronic obstructive pulmonary disease, unspecified: Secondary | ICD-10-CM | POA: Insufficient documentation

## 2022-03-05 NOTE — Patient Instructions (Signed)
Patient Goals/Self-Care Activities: Take all medications as prescribed Attend all scheduled provider appointments do breathing exercises every day follow rescue plan if symptoms flare-up

## 2022-03-05 NOTE — Patient Outreach (Signed)
[facility]) Care Management  03/05/2022  Chelsey Young January 16, 1941 [id number]   Telephone call to patient for nurse call.  Patient doing okay.  Discussed [facility] services and support.  Patient agreeable to nurse calls.  No concerns.    Care Plan : RN CM Plan of care  Updates made by Jon Billings, RN since 03/05/2022 12:00 AM     Problem: Chronic Disease Management and Care Coordination Needs COPD   Priority: High     Long-Range Goal: Development of Plan of Care for Management of COPD   Start Date: 03/05/2022  Expected End Date: 07/19/2022  Priority: High  Note:   Current Barriers:  Chronic Disease Management support and education needs related to COPD   RNCM Clinical Goal(s):  Patient will verbalize basic understanding of  COPD disease process and self health management plan as evidenced by no COPD exacerbations  through collaboration with RN Care manager, provider, and care team.   Interventions: Education and support related to COPD Inter-disciplinary care team collaboration (see longitudinal plan of care) Evaluation of current treatment plan related to  self management and patient's adherence to plan as established by provider   COPD Interventions:  (Status:  New goal.) Long Term Goal Provided patient with basic written and verbal COPD education on self care/management/and exacerbation prevention  Patient Goals/Self-Care Activities: Take all medications as prescribed Attend all scheduled provider appointments do breathing exercises every day follow rescue plan if symptoms flare-up  Follow Up Plan:  Telephone follow up appointment with care management team member scheduled for:  February The patient has been provided with contact information for the care management team and has been advised to call with any health related questions or concerns.         Plan: RN CM will provide ongoing education and support to patient through phone calls.   RN CM  will send welcome packet with consent to patient.   RN CM will send initial barriers letter, assessment, and care plan to primary care physician.   RN CM will contact patient in February and patient agrees to next contact and care plan.  Jone Baseman, RN, MSN [facility] Care Management Care Management Coordinator Direct Line [phone] Toll Free: [phone]  Fax: [phone]

## 2022-03-19 ENCOUNTER — Other Ambulatory Visit: Payer: Self-pay

## 2022-03-19 NOTE — Patient Outreach (Signed)
[facility]) Care Management  03/19/2022  KEIA RASK January 23, 1941 [id number]   RN CM closing case:  pt will be followed for care coordination by [place] Management.  Jone Baseman, RN, MSN [facility] Care Management Care Management Coordinator Direct Line [phone] Toll Free: [phone]  Fax: [phone]

## 2022-03-31 ENCOUNTER — Other Ambulatory Visit: Payer: Self-pay | Admitting: *Deleted

## 2022-03-31 DIAGNOSIS — Z9889 Other specified postprocedural states: Secondary | ICD-10-CM

## 2022-04-14 ENCOUNTER — Ambulatory Visit (HOSPITAL_COMMUNITY)
Admission: RE | Admit: 2022-04-14 | Discharge: 2022-04-14 | Disposition: A | Discharge status: Home / Self Care | Payer: Medicare HMO | Source: Ambulatory Visit | Attending: Vascular Surgery | Admitting: Vascular Surgery

## 2022-04-14 ENCOUNTER — Ambulatory Visit: Payer: Medicare HMO | Admitting: Vascular Surgery

## 2022-04-14 ENCOUNTER — Encounter: Payer: Self-pay | Admitting: Vascular Surgery

## 2022-04-14 VITALS — BP 135/71 | HR 68 | Temp 97.5°F | Resp 14 | Ht 61.0 in | Wt 115.0 lb

## 2022-04-14 DIAGNOSIS — I7143 Infrarenal abdominal aortic aneurysm, without rupture: Secondary | ICD-10-CM

## 2022-04-14 DIAGNOSIS — Z9889 Other specified postprocedural states: Secondary | ICD-10-CM | POA: Diagnosis not present

## 2022-04-14 NOTE — Progress Notes (Signed)
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 female who presents for one year follow-up after stent graft repair of a saccular aneurysm of her infrarenal abdominal aorta.  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    CTA last year showed near complete resolution of the saccular aneurysm associated with the PAU.  Complains of lower extremity leg cramps today.  Still smoking about a half a pack a day.  Past Medical History:  Diagnosis Date   Arthritis    Cancer ([place])    CERVICAL   COPD (chronic obstructive pulmonary disease) (HCC)    Hypercholesteremia    Hypertension    Hypertensive retinopathy    OU   Hypokalemia    Macular degeneration    Non-exu OU   Stroke [facility])    TIA   Thoracic aortic aneurysm without rupture ([place]) 10/29/2016   PER CT CHEST   Vitamin D deficiency     Past Surgical History:  Procedure Laterality Date   ABDOMINAL AORTIC ENDOVASCULAR STENT GRAFT N/A 02/29/2020   Procedure: ABDOMINAL AORTIC ENDOVASCULAR STENT GRAFT;  Surgeon: Marty Heck, MD;  Location: [facility] OR;  Service: Vascular;  Laterality: N/A;   ABDOMINAL HYSTERECTOMY     WITH BSO   CATARACT EXTRACTION Bilateral 2018   Dr. Talbert Forest   CHOLECYSTECTOMY     EYE SURGERY Bilateral 2018   Cat Sx - Dr. Talbert Forest    Family History  Problem Relation Age of Onset   Myasthenia gravis Daughter    Epilepsy Son     SOCIAL HISTORY: Social History   Tobacco Use   Smoking status: Every Day    Packs/day: 0.50    Years: 60.00    Total pack years: 30.00    Types: Cigarettes   Smokeless tobacco: Never  Substance Use Topics   Alcohol use: No    No Known  Allergies  Current Outpatient Medications  Medication Sig Dispense Refill   albuterol (PROVENTIL) (2.5 MG/3ML) 0.083% nebulizer solution Take 2.5 mg by nebulization every 6 (six) hours as needed for wheezing or shortness of breath.     aspirin 81 MG tablet Take 81 mg by mouth daily.     calcitonin, salmon, (MIACALCIN/FORTICAL) 200 UNIT/ACT nasal spray SMARTSIG:Both Nares     calcium carbonate (OSCAL) 1500 (600 Ca) MG TABS tablet 1 tablet with meals     Cholecalciferol (VITAMIN D3) 5000 units CAPS Take 5,000 Units by mouth daily.      fluticasone furoate-vilanterol (BREO ELLIPTA) 100-25 MCG/INH AEPB INHALE 1 PUFF ONCE DAILY     Multiple Vitamins-Minerals (PRESERVISION AREDS 2+MULTI VIT PO) Take 1 capsule by mouth in the morning and at bedtime.     omeprazole (PRILOSEC) 20 MG capsule 1 capsule 30 minutes before morning meal     Potassium 99 MG TABS Take 2 tablets by mouth daily.     potassium chloride (KLOR-CON) 10 MEQ tablet 1 tablet     simvastatin (ZOCOR) 40 MG tablet Take 40 mg by mouth every evening.      TRELEGY ELLIPTA 100-62.5-25 MCG/INH AEPB Inhale 1 puff into the lungs daily.     triamterene-hydrochlorothiazide (DYAZIDE) 37.5-25 MG capsule Take 1 capsule  by mouth daily.     valACYclovir (VALTREX) 1000 MG tablet Take by mouth.     No current facility-administered medications for this visit.    REVIEW OF SYSTEMS:  '[X]'$  denotes positive finding, '[ ]'$  denotes negative finding Cardiac  Comments:  Chest pain or chest pressure:    Shortness of breath upon exertion:    Short of breath when lying flat:    Irregular heart rhythm:        Vascular    Pain in calf, thigh, or hip brought on by ambulation:    Pain in feet at night that wakes you up from your sleep:     Blood clot in your veins:    Leg swelling:         Pulmonary    Oxygen at home:    Productive cough:     Wheezing:         Neurologic    Sudden weakness in arms or legs:     Sudden numbness in arms or legs:      Sudden onset of difficulty speaking or slurred speech:    Temporary loss of vision in one eye:     Problems with dizziness:         Gastrointestinal    Blood in stool:     Vomited blood:         Genitourinary    Burning when urinating:     Blood in urine:        Psychiatric    Major depression:         Hematologic    Bleeding problems:    Problems with blood clotting too easily:        Skin    Rashes or ulcers:        Constitutional    Fever or chills:      PHYSICAL EXAM: Vitals:   04/14/22 0826  BP: 135/71  Pulse: 68  Resp: 14  Temp: (!) 97.5 F (36.4 C)  TempSrc: Temporal  SpO2: 92%  Weight: 115 lb (52.2 kg)  Height: '5\' 1"'$  (1.549 m)    GENERAL: The patient is a well-nourished female, in no acute distress. The vital signs are documented above. CARDIAC: There is a regular rate and rhythm.  VASCULAR:  Palpable femoral pulses bilaterally Palpable DP pulses bilaterally PULMONARY: No respiratory distress. ABDOMEN: Soft and non-tender. MUSCULOSKELETAL: There are no major deformities or cyanosis. NEUROLOGIC: No focal weakness or paresthesias are detected.   DATA:   EVAR duplex today shows patent endovascular aneurysm repair with no signs of aneurysm growth.  CTA abdomen pelvis 03/22/2021 shows good position of the aortic cuffs with near complete resolution of the saccular aneurysm.   Assessment/Plan:  81 year old female status post endovascular treatment of her saccular aneurysm in the infrarenal abdominal aorta with aortic cuffs x2 on 02/29/2020.  EVAR duplex today shows patent endovascular aneurysm repair.  CTA last year showed near complete resolution of the saccular aneurysm associated with the PAU.  She has palpable femoral and pedal pulses.  Discussed we will see her in 1 year in the [facility] clinic for ongoing surveillance with EVAR duplex.    Marty Heck, MD [facility] [place] Office: [phone]

## 2022-05-15 ENCOUNTER — Ambulatory Visit (INDEPENDENT_AMBULATORY_CARE_PROVIDER_SITE_OTHER): Payer: Medicare PPO | Admitting: Ophthalmology

## 2022-05-15 ENCOUNTER — Encounter (INDEPENDENT_AMBULATORY_CARE_PROVIDER_SITE_OTHER): Payer: Self-pay | Admitting: Ophthalmology

## 2022-05-15 DIAGNOSIS — I10 Essential (primary) hypertension: Secondary | ICD-10-CM

## 2022-05-15 DIAGNOSIS — H35033 Hypertensive retinopathy, bilateral: Secondary | ICD-10-CM | POA: Diagnosis not present

## 2022-05-15 DIAGNOSIS — H353134 Nonexudative age-related macular degeneration, bilateral, advanced atrophic with subfoveal involvement: Secondary | ICD-10-CM

## 2022-05-15 DIAGNOSIS — Z961 Presence of intraocular lens: Secondary | ICD-10-CM

## 2022-05-15 NOTE — Progress Notes (Signed)
[facility] Note  05/15/2022     CHIEF COMPLAINT Patient presents for Retina Follow Up  HISTORY OF PRESENT ILLNESS: Chelsey Young is a 81 y.o. female who presents to the clinic today for:   HPI     Retina Follow Up   Patient presents with  Dry AMD.  In both eyes.  This started 6 months ago.  I, the attending physician,  performed the HPI with the patient and updated documentation appropriately.        Comments   Patient here for 6 months retina follow up for non exu ARMD OU. Patient states vision getting worse. Has itching eyes. No eye pain.       Last edited by Bernarda Caffey, MD on 05/15/2022  8:45 PM.    Pt states she feels VA is a bit worse. Unable to see 'peoples heads while watching TV". Pt reports taking AREDs 2 and monitoring with amsler grid.   Referring physician: Deland Pretty, MD [address] [place],  [place] [address]  HISTORICAL INFORMATION:   Selected notes from the MEDICAL RECORD NUMBER Referred by Dr. Marin Comment for concern of macular degeneration OU LEE: 12/22/2019 BCVA: 20/60; 20/60   CURRENT MEDICATIONS: No current outpatient medications on file. (Ophthalmic Drugs)   No current facility-administered medications for this visit. (Ophthalmic Drugs)   Current Outpatient Medications (Other)  Medication Sig   albuterol (PROVENTIL) (2.5 MG/3ML) 0.083% nebulizer solution Take 2.5 mg by nebulization every 6 (six) hours as needed for wheezing or shortness of breath.   aspirin 81 MG tablet Take 81 mg by mouth daily.   calcitonin, salmon, (MIACALCIN/FORTICAL) 200 UNIT/ACT nasal spray SMARTSIG:Both Nares   calcium carbonate (OSCAL) 1500 (600 Ca) MG TABS tablet 1 tablet with meals   Cholecalciferol (VITAMIN D3) 5000 units CAPS Take 5,000 Units by mouth daily.    fluticasone furoate-vilanterol (BREO ELLIPTA) 100-25 MCG/INH AEPB INHALE 1 PUFF ONCE DAILY   Multiple Vitamins-Minerals (PRESERVISION AREDS 2+MULTI VIT PO)  Take 1 capsule by mouth in the morning and at bedtime.   omeprazole (PRILOSEC) 20 MG capsule 1 capsule 30 minutes before morning meal   Potassium 99 MG TABS Take 2 tablets by mouth daily.   potassium chloride (KLOR-CON) 10 MEQ tablet 1 tablet   simvastatin (ZOCOR) 40 MG tablet Take 40 mg by mouth every evening.    TRELEGY ELLIPTA 100-62.5-25 MCG/INH AEPB Inhale 1 puff into the lungs daily.   triamterene-hydrochlorothiazide (DYAZIDE) 37.5-25 MG capsule Take 1 capsule by mouth daily.   valACYclovir (VALTREX) 1000 MG tablet Take by mouth.   No current facility-administered medications for this visit. (Other)   REVIEW OF SYSTEMS: ROS   Positive for: Neurological, Musculoskeletal, Eyes, Respiratory Negative for: Constitutional, Gastrointestinal, Skin, Genitourinary, HENT, Endocrine, Cardiovascular, Psychiatric, Allergic/Imm, Heme/Lymph Last edited by Theodore Demark, COA on 05/15/2022  8:58 AM.     ALLERGIES No Known Allergies  PAST MEDICAL HISTORY Past Medical History:  Diagnosis Date   Arthritis    Cancer ([place])    CERVICAL   COPD (chronic obstructive pulmonary disease) ([place])    Hypercholesteremia    Hypertension    Hypertensive retinopathy    OU   Hypokalemia    Macular degeneration    Non-exu OU   Stroke [facility])    TIA   Thoracic aortic aneurysm without rupture ([place]) 10/29/2016   PER CT CHEST   Vitamin D deficiency    Past Surgical History:  Procedure Laterality Date  ABDOMINAL AORTIC ENDOVASCULAR STENT GRAFT N/A 02/29/2020   Procedure: ABDOMINAL AORTIC ENDOVASCULAR STENT GRAFT;  Surgeon: Marty Heck, MD;  Location: [facility] OR;  Service: Vascular;  Laterality: N/A;   ABDOMINAL HYSTERECTOMY     WITH BSO   CATARACT EXTRACTION Bilateral 2018   Dr. Talbert Forest   CHOLECYSTECTOMY     EYE SURGERY Bilateral 2018   Cat Sx - Dr. Talbert Forest   FAMILY HISTORY Family History  Problem Relation Age of Onset   Myasthenia gravis Daughter    Epilepsy Son    SOCIAL HISTORY Social  History   Tobacco Use   Smoking status: Every Day    Packs/day: 0.50    Years: 60.00    Total pack years: 30.00    Types: Cigarettes   Smokeless tobacco: Never  Vaping Use   Vaping Use: Never used  Substance Use Topics   Alcohol use: No   Drug use: No       OPHTHALMIC EXAM:  Base Eye Exam     Visual Acuity (Snellen - Linear)       Right Left   Dist [place] 20/60 -2 20/60 -2   Dist ph [place] NI NI         Tonometry (Tonopen, 8:56 AM)       Right Left   Pressure 13 14         Pupils       Dark Light Shape React APD   Right 3 2 Round Brisk None   Left 3 2 Round Brisk None         Visual Fields (Counting fingers)       Left Right    Full Full         Extraocular Movement       Right Left    Full, Ortho Full, Ortho         Neuro/Psych     Oriented x3: Yes   Mood/Affect: Normal         Dilation     Both eyes: 1.0% Mydriacyl, 2.5% Phenylephrine @ 8:55 AM           Slit Lamp and Fundus Exam     Slit Lamp Exam       Right Left   Lids/Lashes Dermatochalasis - upper lid, mild Meibomian gland dysfunction Dermatochalasis - upper lid, mild Meibomian gland dysfunction   Conjunctiva/Sclera White and quiet White and quiet   Cornea Mild arcus, well healed temporal cataract wounds,  Mild arcus, well healed temporal cataract wounds,    Anterior Chamber Deep and quiet Deep and quiet   Iris Round and dilated Round and moderately dilated to 5.76m   Lens PC IOL in good position, trace Posterior capsular opacification PC IOL in good position   Anterior Vitreous Vitreous syneresis Vitreous syneresis         Fundus Exam       Right Left   Disc Pink and Sharp, Compact, 360 PPA Pink and Sharp, Compact, temporal PPA   C/D Ratio 0.4 0.3   Macula Flat, Blunted foveal reflex, Drusen, RPE mottling, clumping and atrophy -- GA, No heme or edema Flat, Blunted foveal reflex, Drusen, RPE mottling, clumping and atrophy -- GA, No heme or edema   Vessels attenuated,  Tortuous attenuated, Tortuous   Periphery Attached, mild reticular degeneration, No heme Attached, reticular degeneration, No heme           Refraction     Wearing Rx       Sphere  Right None   Left None           IMAGING AND PROCEDURES  Imaging and Procedures for '@TODAY'$ @  OCT, Retina - OU - Both Eyes       Right Eye Quality was good. Central Foveal Thickness: 363. Progression has been stable. Findings include no IRF, no SRF, abnormal foveal contour, retinal drusen , epiretinal membrane, macular pucker, outer retinal atrophy (Persistent ORA/GA, mild ERM and pucker -- stable).   Left Eye Quality was good. Central Foveal Thickness: 304. Progression has been stable. Findings include no IRF, no SRF, abnormal foveal contour, retinal drusen , epiretinal membrane, outer retinal atrophy (Persistent of ORA/GA).   Notes *Images captured and stored on drive  Diagnosis / Impression:  Non exudative ARMD OU OD: Persistent ORA/GA, mild ERM and pucker -- stable OS: Persistent ORA/GA -- stable   Clinical management:  See below  Abbreviations: NFP - Normal foveal profile. CME - cystoid macular edema. PED - pigment epithelial detachment. IRF - intraretinal fluid. SRF - subretinal fluid. EZ - ellipsoid zone. ERM - epiretinal membrane. ORA - outer retinal atrophy. ORT - outer retinal tubulation. SRHM - subretinal hyper-reflective material            ASSESSMENT/PLAN:    ICD-10-CM   1. Advanced atrophic nonexudative age-related macular degeneration of both eyes with subfoveal involvement  H35.3134 OCT, Retina - OU - Both Eyes    2. Essential hypertension  I10     3. Hypertensive retinopathy of both eyes  H35.033     4. Pseudophakia of both eyes  Z96.1      1. Age related macular degeneration, non-exudative, both eyes  - advanced atrophic stage w/ +ORA and GA OU  - focal linear IRH inferior macula OS noted on exam, 01.28.22 -- stably resolved, no heme  - no fluid or edema  on OCT or exam  - BCVA 20/60 OU -- stable  - OCT shows OD: Persistent ORA/GA, mild ERM and pucker -- stable; OS: Persistent ORA/GA -- stable  - pt reports continued smoking of 1/2 ppd -- discussed smoking risks related to ARMD and advised cessation of smoking  - pt reports that she has stopped driving  - Recommend amsler grid monitoring  - f/u 6-9 months, sooner prn -- DFE, OCT  2,3. Hypertensive retinopathy OU  - discussed importance of tight BP control  - monitor  4. Pseudophakia OU  - s/p CE/IOL OU (Dr. Talbert Forest, 2018)  - IOLs in good position, doing well  - monitor  Ophthalmic Meds Ordered this visit:  No orders of the defined types were placed in this encounter.  This document serves as a record of services personally performed by Gardiner Sleeper, MD, PhD. It was created on their behalf by San Jetty. Owens Shark, OA an ophthalmic technician. The creation of this record is the provider's dictation and/or activities during the visit.    Electronically signed by: San Jetty. Owens Shark, OA '@TODAY'$ @ 8:46 PM  This document serves as a record of services personally performed by Gardiner Sleeper, MD, PhD. It was created on their behalf by Orvan Falconer, an ophthalmic technician. The creation of this record is the provider's dictation and/or activities during the visit.    Electronically signed by: Orvan Falconer, OA, 05/15/22  8:46 PM  Gardiner Sleeper, M.D., Ph.D. Diseases & Surgery of the Retina and Vitreous [facility]  I have reviewed the above documentation for accuracy and completeness, and I agree with  the above. Gardiner Sleeper, M.D., Ph.D. 05/15/22 8:48 PM  Abbreviations: M myopia (nearsighted); A astigmatism; H hyperopia (farsighted); P presbyopia; Mrx spectacle prescription;  CTL contact lenses; OD right eye; OS left eye; OU both eyes  XT exotropia; ET esotropia; PEK punctate epithelial keratitis; PEE punctate epithelial erosions; DES dry eye syndrome; MGD  meibomian gland dysfunction; ATs artificial tears; PFAT's preservative free artificial tears; Bigelow nuclear sclerotic cataract; PSC posterior subcapsular cataract; ERM epi-retinal membrane; PVD posterior vitreous detachment; RD retinal detachment; DM diabetes mellitus; DR diabetic retinopathy; NPDR non-proliferative diabetic retinopathy; PDR proliferative diabetic retinopathy; CSME clinically significant macular edema; DME diabetic macular edema; dbh dot blot hemorrhages; CWS cotton wool spot; POAG primary open angle glaucoma; C/D cup-to-disc ratio; HVF humphrey visual field; GVF goldmann visual field; OCT optical coherence tomography; IOP intraocular pressure; BRVO Branch retinal vein occlusion; CRVO central retinal vein occlusion; CRAO central retinal artery occlusion; BRAO branch retinal artery occlusion; RT retinal tear; SB scleral buckle; PPV pars plana vitrectomy; VH Vitreous hemorrhage; PRP panretinal laser photocoagulation; IVK intravitreal kenalog; VMT vitreomacular traction; MH Macular hole;  NVD neovascularization of the disc; NVE neovascularization elsewhere; AREDS age related eye disease study; ARMD age related macular degeneration; POAG primary open angle glaucoma; EBMD epithelial/anterior basement membrane dystrophy; ACIOL anterior chamber intraocular lens; IOL intraocular lens; PCIOL posterior chamber intraocular lens; Phaco/IOL phacoemulsification with intraocular lens placement; Lead photorefractive keratectomy; LASIK laser assisted in situ keratomileusis; HTN hypertension; DM diabetes mellitus; COPD chronic obstructive pulmonary disease.

## 2022-05-25 DIAGNOSIS — N281 Cyst of kidney, acquired: Secondary | ICD-10-CM | POA: Diagnosis not present

## 2022-06-17 DIAGNOSIS — Z0000 Encounter for general adult medical examination without abnormal findings: Secondary | ICD-10-CM | POA: Diagnosis not present

## 2022-06-24 ENCOUNTER — Telehealth: Payer: Self-pay

## 2022-06-24 DIAGNOSIS — F17200 Nicotine dependence, unspecified, uncomplicated: Secondary | ICD-10-CM | POA: Diagnosis not present

## 2022-06-24 DIAGNOSIS — E7800 Pure hypercholesterolemia, unspecified: Secondary | ICD-10-CM | POA: Diagnosis not present

## 2022-06-24 DIAGNOSIS — E876 Hypokalemia: Secondary | ICD-10-CM | POA: Diagnosis not present

## 2022-06-24 DIAGNOSIS — J449 Chronic obstructive pulmonary disease, unspecified: Secondary | ICD-10-CM | POA: Diagnosis not present

## 2022-06-24 DIAGNOSIS — E559 Vitamin D deficiency, unspecified: Secondary | ICD-10-CM | POA: Diagnosis not present

## 2022-06-24 NOTE — Patient Outreach (Signed)
  Care Coordination   In Person Provider Office Visit Note   06/24/2022 Name: Chelsey Young MRN: [id number] DOB: [birth year]  Chelsey Young is a 81 y.o. year old female who sees Chelsey Pretty, MD for primary care. I engaged with Chelsey Young in the providers office today.  What matters to the patients health and wellness today?  Getting Brio    Goals Addressed             This Visit's Progress    COMPLETED: Care Coordination Activities-No follow up required       Care Coordination Interventions: Advised patient to Discussed [facility] services and support. Referral to [facility] for possible patient assistance with [facility].          SDOH assessments and interventions completed:  No     Care Coordination Interventions:  Yes, provided   Follow up plan: No further intervention required.   Encounter Outcome:  Pt. Visit Completed   Chelsey Baseman, RN, MSN [place] [facility] Care Management Coordinator Direct Line [phone]

## 2022-06-24 NOTE — Patient Instructions (Signed)
Visit Information  Thank you for taking time to visit with me today. Please don't hesitate to contact me if I can be of assistance to you.   Following are the goals we discussed today:   Goals Addressed             This Visit's Progress    COMPLETED: Care Coordination Activities-No follow up required       Care Coordination Interventions: Advised patient to Discussed [facility] services and support. Referral to [facility] for possible patient assistance with [facility].           If you are experiencing a Mental Health or [place] or need someone to talk to, please call the Suicide and Crisis Lifeline: 988   The patient verbalized understanding of instructions, educational materials, and care plan provided today and agreed to receive a mailed copy of patient instructions, educational materials, and care plan.   No further follow up required:    Jone Baseman, RN, MSN [place] [facility] Care Management Coordinator Direct Line [phone]

## 2022-07-15 ENCOUNTER — Telehealth: Payer: Self-pay

## 2022-07-15 NOTE — Patient Outreach (Signed)
  Care Coordination   Collaboration  Visit Note   07/15/2022 Name: Chelsey Young MRN: [id number] DOB: [birth year]  Chelsey Young is a 81 y.o. year old female who sees Deland Pretty, MD for primary care. I  spoke with Thedora Hinders to give update on patient and Breo.  Advised that patient has been referred over to [facility] for [facility]CM.    What matters to the patients health and wellness today?  N/A    Goals Addressed             This Visit's Progress    COMPLETED: Care Coordination Activities-No follow up required       Care Coordination Interventions: Collaborated with Thedora Hinders, NP regarding referral for [facility].  Patient is now with [facility] for CCM for Breo.           SDOH assessments and interventions completed:  No     Care Coordination Interventions:  No, not indicated   Follow up plan: No further intervention required.   Encounter Outcome:  Pt. Visit Completed   Jone Baseman, RN, MSN [place] [facility] Care Management Coordinator Direct Line [phone]

## 2022-07-21 IMAGING — CT CT CTA ABD/PEL W/CM AND/OR W/O CM
2 of 10 series · 9 of 46 positions shown, 15 images · IV contrast (omnipaque)
Comparison: Prior CTA abdomen/pelvis 03/20/2020

CLINICAL DATA: Abdominal aortic aneurysm status post repair.
Surveillance imaging.

EXAM:
CTA ABDOMEN AND PELVIS WITHOUT AND WITH CONTRAST
TECHNIQUE: Multidetector CT imaging of the abdomen and pelvis was performed
using the standard protocol during bolus administration of
intravenous contrast. Multiplanar reconstructed images and MIPs were
obtained and reviewed to evaluate the vascular anatomy.
CONTRAST:  100mL OMNIPAQUE IOHEXOL 350 MG/ML SOLN

[Series 6: axial arterial · axial · arterial · 0.67mm/px · z∈[-341,-47]mm · 7 of 132 slices shown, 12 images]
[im 17/132  soft-tissue]
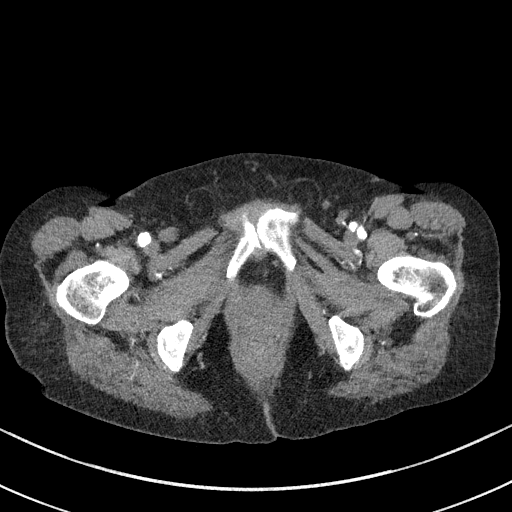
[im 17/132  bone]
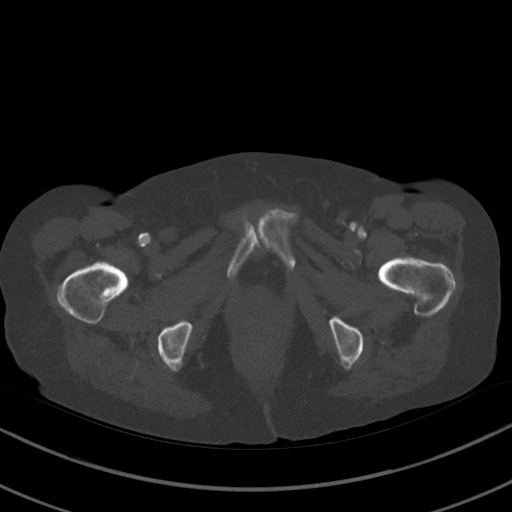
[im 33/132  soft-tissue]
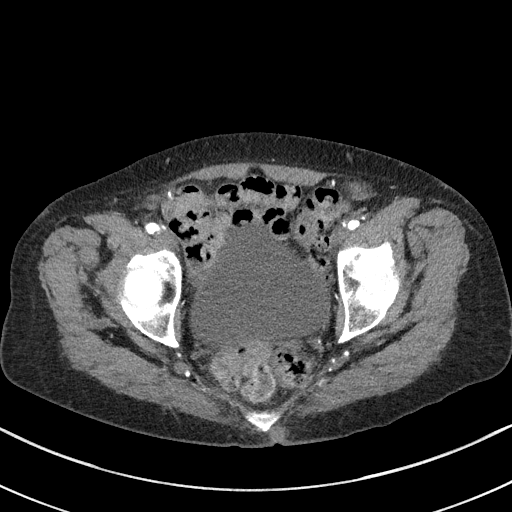
[im 50/132  soft-tissue]
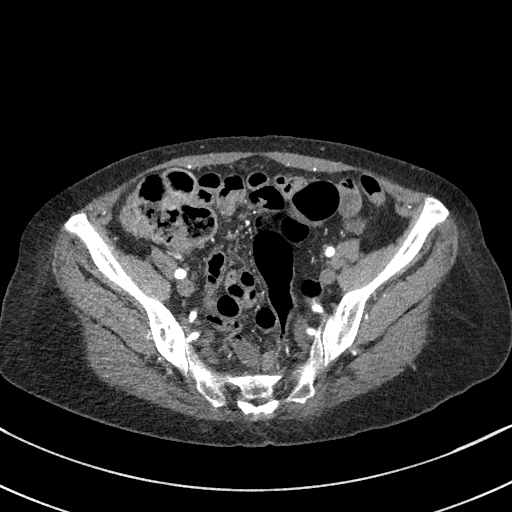
[im 66/132  soft-tissue]
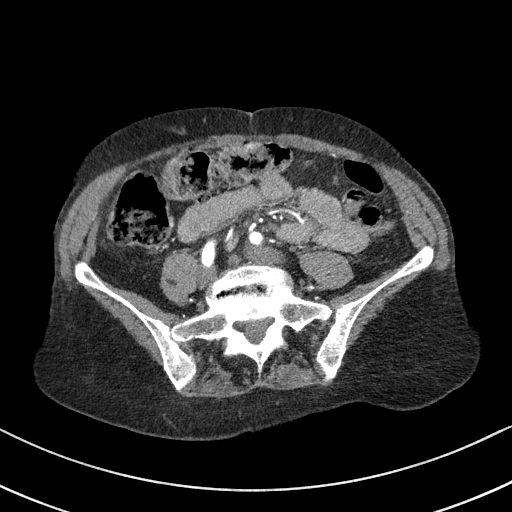
[im 66/132  lung]
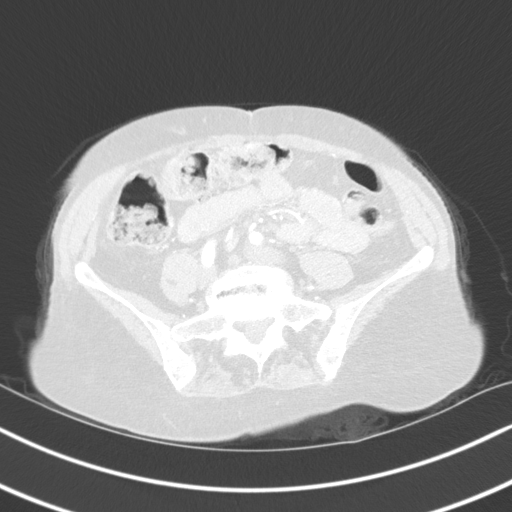
[im 82/132  soft-tissue]
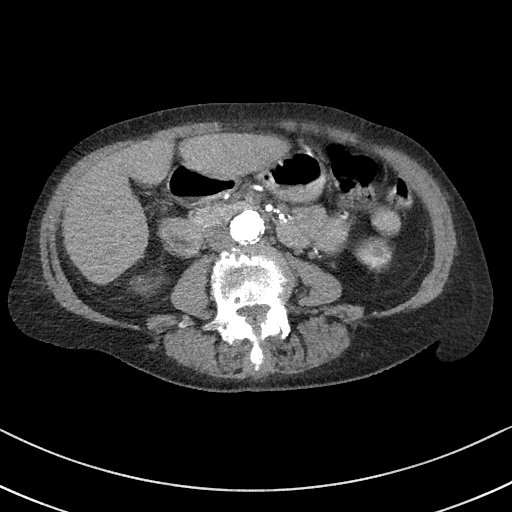
[im 82/132  lung]
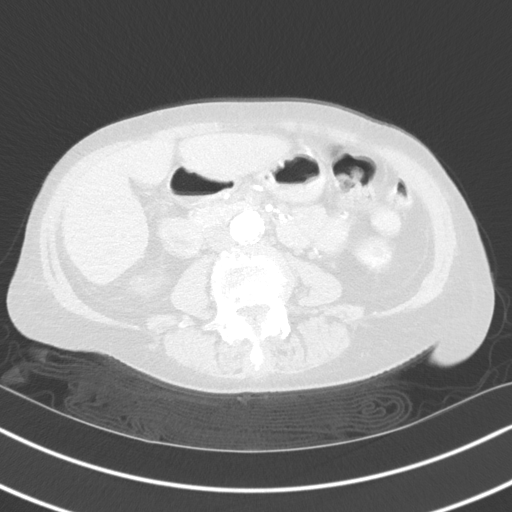
[im 99/132  soft-tissue]
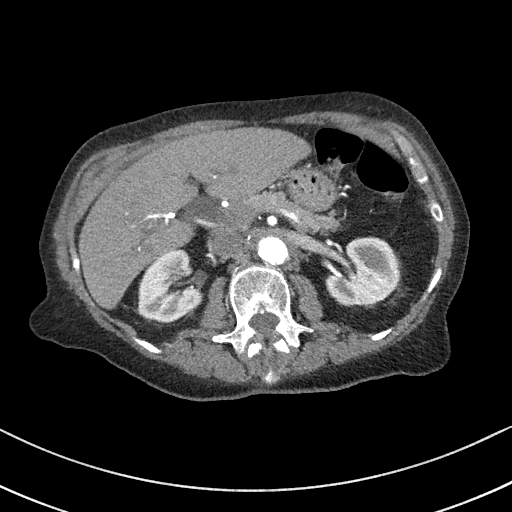
[im 99/132  lung]
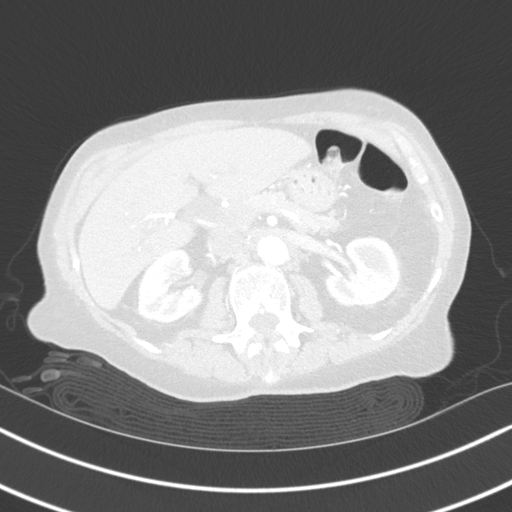
[im 115/132  soft-tissue]
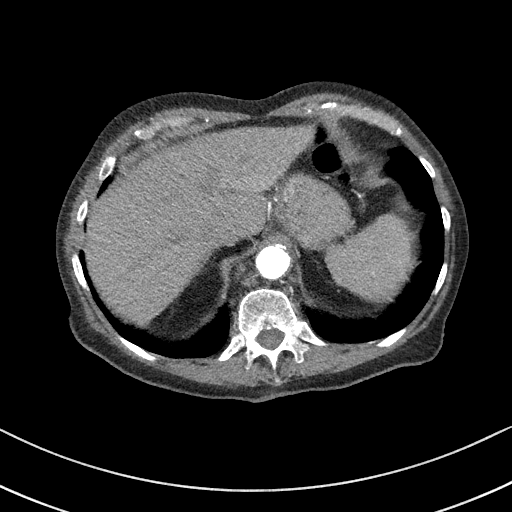
[im 115/132  lung]
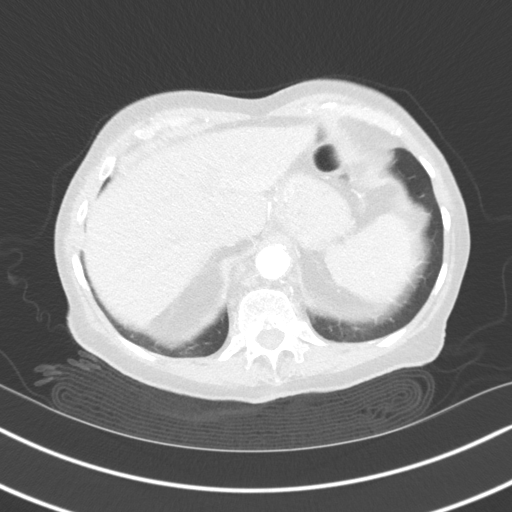

[Series 8: coronals · coronal · 0.64mm/px · 2 of 120 slices shown, 3 images]
[im 40/120  soft-tissue]
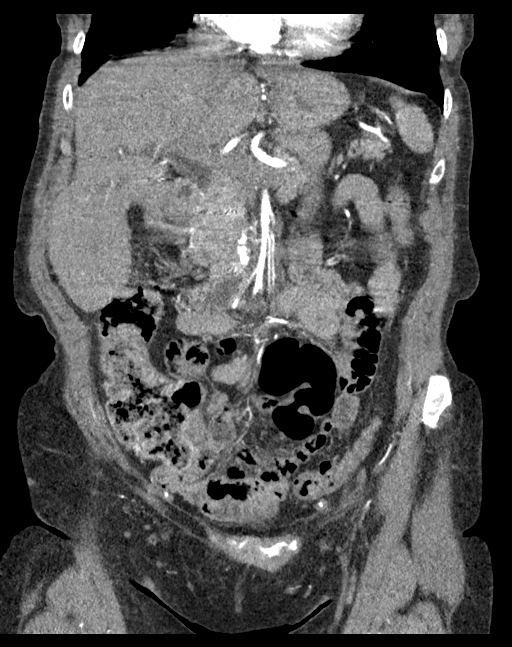
[im 40/120  bone]
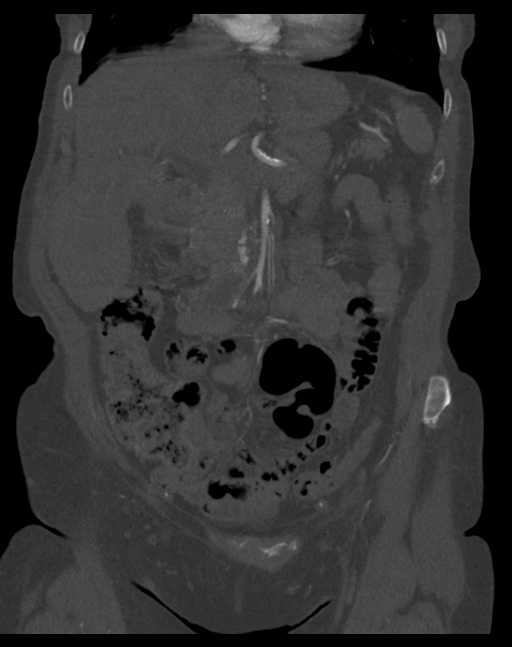
[im 80/120  soft-tissue]
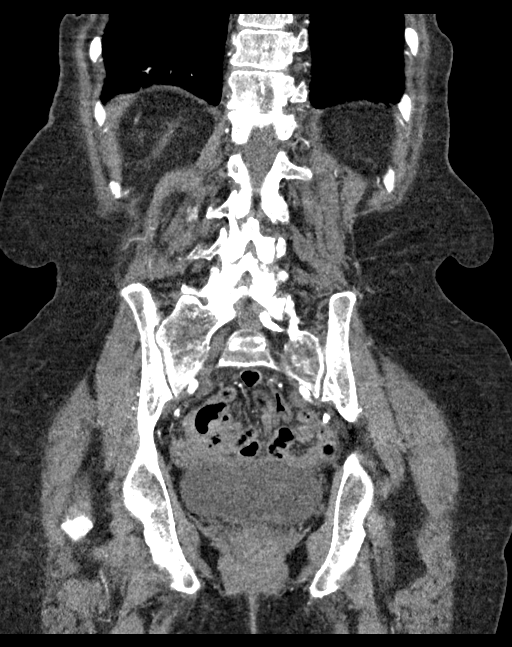

[9 of 46 positions shown; findings below may reference images not displayed]

FINDINGS: VASCULAR

Aorta: Surgical changes of stent graft repair of focal penetrating
atherosclerotic ulcer resulting in pseudoaneurysm formation in the
infrarenal abdominal aorta. A stent graft is present within the
infrarenal aorta terminating above the aortic bifurcation. The graft
does not extend into the iliac arteries. There has been complete
coverage and resolution of the prior penetrating atherosclerotic
ulcer. The maximal aortic diameter is now only 2.9 cm. There is no
perfusion in the excluded aneurysm sac.

Celiac: Patent without evidence of aneurysm, dissection, vasculitis
or significant stenosis.

SMA: Patent without evidence of aneurysm, dissection, vasculitis or
significant stenosis.

Renals: There are 2 left-sided renal arteries and a single
right-sided renal artery. Minimal atherosclerotic plaque without
significant stenosis. No loss of perfusion to either kidney.

IMA: Occluded at the origin. The distal branches opacify via
collateral flow as expected.

Inflow: Mild scattered atherosclerotic calcification without
aneurysm, dissection or significant stenosis.

Proximal Outflow: Mild scattered atherosclerotic calcification
without aneurysm, dissection or significant stenosis.

Veins: No focal venous abnormality.

Review of the MIP images confirms the above findings.

NON-VASCULAR

Lower chest: No acute abnormality.

Hepatobiliary: No focal liver abnormality is seen. Status post
cholecystectomy. No biliary dilatation.

Pancreas: Unremarkable. No pancreatic ductal dilatation or
surrounding inflammatory changes.

Spleen: No splenic injury or perisplenic hematoma.

Adrenals/Urinary Tract: Adreniform thickening of both adrenal glands
without nodularity. No evidence of hydronephrosis, nephrolithiasis
or enhancing renal mass. Approximately 2.6 cm low-attenuation lesion
arising from the lower pole of the right kidney measures up to
cm. This is significantly larger compared to 1.5 cm on the prior
study. Unremarkable ureters and bladder.

Stomach/Bowel: Colonic diverticular disease without CT evidence of
active inflammation. No focal bowel wall thickening or evidence of
obstruction. Normal appendix in the right lower quadrant.

Lymphatic: No suspicious lymphadenopathy.

Reproductive: Prostate is unremarkable.

Other: No abdominal wall hernia or abnormality. No abdominopelvic
ascites.

Musculoskeletal: No acute or significant osseous findings.
IMPRESSION: VASCULAR

1. Successful endovascular repair of penetrating aortic ulceration
resulting in a focal fusiform saccular aneurysm of the infrarenal
abdominal aorta. The OLIVE has essentially resolved. No evidence of
endoleak or other complicating feature.
2. Scattered atherosclerotic plaque. Aortic Atherosclerosis
([id number]).

NON-VASCULAR

1. Enlarging cyst in the lower pole of the right kidney. On some
series, there is intermediate attenuation within the cyst. Most
likely, this represents interval hemorrhage into the existing
otherwise simple cyst. However, the degree of enlargement is
significant over a 1 year period. Consider further evaluation with
renal protocol MRI of the abdomen to confirm that this represents a
complex/hemorrhagic cyst and not a low-grade papillary renal
neoplasm.
2. Additional ancillary findings as above without significant
interval change.

These results will be called to the ordering clinician or
representative by the Radiologist Assistant, and communication
documented in the PACS or [REDACTED].

## 2022-08-19 DIAGNOSIS — E7800 Pure hypercholesterolemia, unspecified: Secondary | ICD-10-CM | POA: Diagnosis not present

## 2022-08-19 DIAGNOSIS — I10 Essential (primary) hypertension: Secondary | ICD-10-CM | POA: Diagnosis not present

## 2022-08-19 DIAGNOSIS — E559 Vitamin D deficiency, unspecified: Secondary | ICD-10-CM | POA: Diagnosis not present

## 2022-08-19 DIAGNOSIS — J449 Chronic obstructive pulmonary disease, unspecified: Secondary | ICD-10-CM | POA: Diagnosis not present

## 2022-12-02 ENCOUNTER — Ambulatory Visit (INDEPENDENT_AMBULATORY_CARE_PROVIDER_SITE_OTHER): Payer: Medicare PPO | Admitting: Podiatry

## 2022-12-02 ENCOUNTER — Encounter: Payer: Self-pay | Admitting: Podiatry

## 2022-12-02 DIAGNOSIS — L602 Onychogryphosis: Secondary | ICD-10-CM | POA: Diagnosis not present

## 2022-12-02 DIAGNOSIS — M79609 Pain in unspecified limb: Secondary | ICD-10-CM

## 2022-12-02 DIAGNOSIS — B351 Tinea unguium: Secondary | ICD-10-CM

## 2022-12-02 DIAGNOSIS — L84 Corns and callosities: Secondary | ICD-10-CM

## 2022-12-02 NOTE — Progress Notes (Signed)
Chief Complaint  Patient presents with   Ingrown Toenail    Nail trim requested, patient present for possible ingrowns on bilateral great hallux, has been a problem for 1 year, has tried to self treat ingrowns, ineffective      HPI: Patient is 82 y.o. female who presents today for painful and incurvated toenails on both feet. The great toenails are curving lateral and pressing on the second toenails causing pain.  She also has calluses on both feet which are uncomfortable as well.    Patient Active Problem List   Diagnosis Date Noted   Chronic obstructive pulmonary disease (HCC) 03/05/2022   Abdominal aortic aneurysm (AAA) (HCC) 02/29/2020   AAA (abdominal aortic aneurysm) without rupture (HCC) 01/23/2020   Cancer (HCC)    Vitamin D deficiency    Thoracic aortic aneurysm without rupture (HCC) 10/29/2016    Current Outpatient Medications on File Prior to Visit  Medication Sig Dispense Refill   albuterol (PROVENTIL) (2.5 MG/3ML) 0.083% nebulizer solution Take 2.5 mg by nebulization every 6 (six) hours as needed for wheezing or shortness of breath.     aspirin 81 MG tablet Take 81 mg by mouth daily.     calcitonin, salmon, (MIACALCIN/FORTICAL) 200 UNIT/ACT nasal spray SMARTSIG:Both Nares     calcium carbonate (OSCAL) 1500 (600 Ca) MG TABS tablet 1 tablet with meals     Cholecalciferol (VITAMIN D3) 5000 units CAPS Take 5,000 Units by mouth daily.      fluticasone furoate-vilanterol (BREO ELLIPTA) 100-25 MCG/INH AEPB INHALE 1 PUFF ONCE DAILY     Multiple Vitamins-Minerals (PRESERVISION AREDS 2+MULTI VIT PO) Take 1 capsule by mouth in the morning and at bedtime.     omeprazole (PRILOSEC) 20 MG capsule 1 capsule 30 minutes before morning meal     Potassium 99 MG TABS Take 2 tablets by mouth daily.     potassium chloride (KLOR-CON) 10 MEQ tablet 1 tablet     simvastatin (ZOCOR) 40 MG tablet Take 40 mg by mouth every evening.      TRELEGY ELLIPTA 100-62.5-25 MCG/INH AEPB Inhale 1 puff into  the lungs daily.     triamterene-hydrochlorothiazide (DYAZIDE) 37.5-25 MG capsule Take 1 capsule by mouth daily.     valACYclovir (VALTREX) 1000 MG tablet Take by mouth.     No current facility-administered medications on file prior to visit.    No Known Allergies  Review of Systems No fevers, chills, nausea, muscle aches, no difficulty breathing, no calf pain, no chest pain or shortness of breath.   Physical Exam  GENERAL APPEARANCE: Alert, conversant. Appropriately groomed. No acute distress.   VASCULAR: Pedal pulses palpable DP and PT bilateral.  Capillary refill time is immediate to all digits,  Proximal to distal cooling it warm to warm.  Digital perfusion adequate.   NEUROLOGIC: sensation is intact to 5.07 monofilament at 5/5 sites bilateral.  Light touch is intact bilateral, vibratory sensation intact bilateral  MUSCULOSKELETAL: acceptable muscle strength, tone and stability bilateral.   Hammertoe contracture deformities noted with swan neck deformity right second toe.  No pain, crepitus or limitation noted with foot and ankle range of motion bilateral.   DERMATOLOGIC: skin is warm, supple, and dry.  No open lesions noted.  No rash, no pre ulcerative lesions. Digital nails are thick, discolored, dystrophic, brittle with subungual debris present and clinically mycotic x 10.  Bilateral hallux nails curve laterally and are pressing on the second digits.pincer callus is present bilateral halluces as well as a callus submetatarsal 3  left foot and sub metatarsal 1 bilateral.    Assessment     ICD-10-CM   1. Pain due to onychomycosis of nail  B35.1    M79.609     2. Onychogryphosis  L60.2     3. Callus of foot  L84         Plan  Recommended debridement of the toenails today and this was carried out via manual and mechanical means without complication.  I also pared her calluses  today on plantar left foot and bilateral plantar hallux and first metatarsa. Areas. .  She will  return in 3 months or as needed for continued care. If any concerns arise in the mean time she will call.

## 2023-01-25 DIAGNOSIS — E559 Vitamin D deficiency, unspecified: Secondary | ICD-10-CM | POA: Diagnosis not present

## 2023-01-25 DIAGNOSIS — I10 Essential (primary) hypertension: Secondary | ICD-10-CM | POA: Diagnosis not present

## 2023-01-25 DIAGNOSIS — E7800 Pure hypercholesterolemia, unspecified: Secondary | ICD-10-CM | POA: Diagnosis not present

## 2023-01-25 DIAGNOSIS — N390 Urinary tract infection, site not specified: Secondary | ICD-10-CM | POA: Diagnosis not present

## 2023-02-01 DIAGNOSIS — J449 Chronic obstructive pulmonary disease, unspecified: Secondary | ICD-10-CM | POA: Diagnosis not present

## 2023-02-01 DIAGNOSIS — F17200 Nicotine dependence, unspecified, uncomplicated: Secondary | ICD-10-CM | POA: Diagnosis not present

## 2023-02-01 DIAGNOSIS — E7800 Pure hypercholesterolemia, unspecified: Secondary | ICD-10-CM | POA: Diagnosis not present

## 2023-02-01 DIAGNOSIS — I10 Essential (primary) hypertension: Secondary | ICD-10-CM | POA: Diagnosis not present

## 2023-02-01 DIAGNOSIS — Z0000 Encounter for general adult medical examination without abnormal findings: Secondary | ICD-10-CM | POA: Diagnosis not present

## 2023-02-01 DIAGNOSIS — E559 Vitamin D deficiency, unspecified: Secondary | ICD-10-CM | POA: Diagnosis not present

## 2023-02-01 DIAGNOSIS — F5101 Primary insomnia: Secondary | ICD-10-CM | POA: Diagnosis not present

## 2023-02-11 NOTE — Progress Notes (Signed)
[facility] - Clinic Note  02/12/2023     CHIEF COMPLAINT Patient presents for Retina Follow Up  HISTORY OF PRESENT ILLNESS: Chelsey RODRIGES is a 82 y.o. female who presents to the clinic today for:   HPI     Retina Follow Up   Patient presents with  Wet AMD.  In both eyes.  This started 9 months ago.  Duration of 9 months.  Since onset it is stable.  I, the attending physician,  performed the HPI with the patient and updated documentation appropriately.        Comments   9 month retina follow up AMD and IVA OD pt is reporting no vision changes noticed she denies any flashes or floaters       Last edited by Rennis Chris, MD on 02/12/2023  3:47 PM.    Pt states her vision has decreased   Referring physician: Merri Brunette, MD [address] [place],  [place] [address]  HISTORICAL INFORMATION:   Selected notes from the MEDICAL RECORD NUMBER Referred by Dr. Conley Rolls for concern of macular degeneration OU LEE: 12/22/2019 BCVA: 20/60; 20/60   CURRENT MEDICATIONS: No current outpatient medications on file. (Ophthalmic Drugs)   No current facility-administered medications for this visit. (Ophthalmic Drugs)   Current Outpatient Medications (Other)  Medication Sig   albuterol (PROVENTIL) (2.5 MG/3ML) 0.083% nebulizer solution Take 2.5 mg by nebulization every 6 (six) hours as needed for wheezing or shortness of breath.   aspirin 81 MG tablet Take 81 mg by mouth daily.   calcitonin, salmon, (MIACALCIN/FORTICAL) 200 UNIT/ACT nasal spray SMARTSIG:Both Nares   calcium carbonate (OSCAL) 1500 (600 Ca) MG TABS tablet 1 tablet with meals   Cholecalciferol (VITAMIN D3) 5000 units CAPS Take 5,000 Units by mouth daily.    fluticasone furoate-vilanterol (BREO ELLIPTA) 100-25 MCG/INH AEPB INHALE 1 PUFF ONCE DAILY   Multiple Vitamins-Minerals (PRESERVISION AREDS 2+MULTI VIT PO) Take 1 capsule by mouth in the morning and at bedtime.   omeprazole (PRILOSEC)  20 MG capsule 1 capsule 30 minutes before morning meal   Potassium 99 MG TABS Take 2 tablets by mouth daily.   potassium chloride (KLOR-CON) 10 MEQ tablet 1 tablet   simvastatin (ZOCOR) 40 MG tablet Take 40 mg by mouth every evening.    TRELEGY ELLIPTA 100-62.5-25 MCG/INH AEPB Inhale 1 puff into the lungs daily.   triamterene-hydrochlorothiazide (DYAZIDE) 37.5-25 MG capsule Take 1 capsule by mouth daily.   valACYclovir (VALTREX) 1000 MG tablet Take by mouth.   No current facility-administered medications for this visit. (Other)   REVIEW OF SYSTEMS: ROS   Positive for: Neurological, Musculoskeletal, Eyes, Respiratory Negative for: Constitutional, Gastrointestinal, Skin, Genitourinary, HENT, Endocrine, Cardiovascular, Psychiatric, Allergic/Imm, Heme/Lymph Last edited by Etheleen Mayhew, COT on 02/12/2023  8:32 AM.      ALLERGIES No Known Allergies  PAST MEDICAL HISTORY Past Medical History:  Diagnosis Date   Arthritis    Cancer (HCC)    CERVICAL   COPD (chronic obstructive pulmonary disease) (HCC)    Hypercholesteremia    Hypertension    Hypertensive retinopathy    OU   Hypokalemia    Macular degeneration    Non-exu OU   Stroke [facility])    TIA   Thoracic aortic aneurysm without rupture (HCC) 10/29/2016   PER CT CHEST   Vitamin D deficiency    Past Surgical History:  Procedure Laterality Date   ABDOMINAL AORTIC ENDOVASCULAR STENT GRAFT N/A 02/29/2020   Procedure: ABDOMINAL  AORTIC ENDOVASCULAR STENT GRAFT;  Surgeon: Cephus Shelling, MD;  Location: [facility] OR;  Service: Vascular;  Laterality: N/A;   ABDOMINAL HYSTERECTOMY     WITH BSO   CATARACT EXTRACTION Bilateral 2018   Dr. Vonna Kotyk   CHOLECYSTECTOMY     EYE SURGERY Bilateral 2018   Cat Sx - Dr. Vonna Kotyk   FAMILY HISTORY Family History  Problem Relation Age of Onset   Myasthenia gravis Daughter    Epilepsy Son    SOCIAL HISTORY Social History   Tobacco Use   Smoking status: Every Day    Current packs/day:  0.50    Average packs/day: 0.5 packs/day for 60.0 years (30.0 ttl pk-yrs)    Types: Cigarettes   Smokeless tobacco: Never  Vaping Use   Vaping status: Never Used  Substance Use Topics   Alcohol use: No   Drug use: No       OPHTHALMIC EXAM:  Base Eye Exam     Visual Acuity (Snellen - Linear)       Right Left   Dist [place] 20/100 -2 CF at 3'   Dist ph Florin NI NI         Tonometry (Tonopen, 8:39 AM)       Right Left   Pressure 14 16         Pupils       Pupils Dark Light Shape React APD   Right PERRL 3 2 Round Brisk None   Left PERRL 3 2 Round Brisk None         Visual Fields       Left Right    Full Full         Extraocular Movement       Right Left    Full, Ortho Full, Ortho         Neuro/Psych     Oriented x3: Yes   Mood/Affect: Normal         Dilation     Both eyes: 2.5% Phenylephrine @ 8:39 AM           Slit Lamp and Fundus Exam     Slit Lamp Exam       Right Left   Lids/Lashes Dermatochalasis - upper lid Dermatochalasis - upper lid   Conjunctiva/Sclera White and quiet White and quiet   Cornea arcus, well healed temporal cataract wounds,  Mild arcus, well healed temporal cataract wounds, 1+ inferior Punctate epithelial erosions   Anterior Chamber deep and clear deep and clear   Iris Round and dilated Round and moderately dilated to 5.33mm   Lens PC IOL in good position, trace Posterior capsular opacification PC IOL in good position   Anterior Vitreous mild syneresis mild syneresis         Fundus Exam       Right Left   Disc Pink and Sharp, Compact, 360 PPA Pink and Sharp, Compact, temporal PPA   C/D Ratio 0.4 0.3   Macula Flat, Blunted foveal reflex, Drusen, RPE mottling, clumping and atrophy -- central GA slightly increased, No heme or edema Flat, Blunted foveal reflex, Drusen, RPE mottling, clumping and atrophy -- central GA, No heme or edema   Vessels attenuated, mild tortuosity attenuated, mild tortuosity   Periphery  Attached, mild reticular degeneration, No heme Attached, reticular degeneration, No heme           Refraction     Wearing Rx       Sphere   Right None   Left None  Manifest Refraction   Unable to correct any better with MR           IMAGING AND PROCEDURES  Imaging and Procedures for @TODAY @  OCT, Retina - OU - Both Eyes       Right Eye Quality was good. Central Foveal Thickness: 351. Progression has worsened. Findings include no IRF, no SRF, abnormal foveal contour, retinal drusen , epiretinal membrane, macular pucker, outer retinal atrophy (Interval progression of central GA seen best on en face imaging, mild ERM and pucker -- stable).   Left Eye Quality was good. Central Foveal Thickness: 304. Progression has worsened. Findings include no IRF, no SRF, abnormal foveal contour, retinal drusen , epiretinal membrane, outer retinal atrophy (Mild progression of ORA/GA subfovea ).   Notes *Images captured and stored on drive  Diagnosis / Impression:  Non exudative ARMD OU OD: Interval progression of central GA seen best on en face imaging, mild ERM and pucker -- stable OS: Mild progression of ORA/GA subfovea    Clinical management:  See below  Abbreviations: NFP - Normal foveal profile. CME - cystoid macular edema. PED - pigment epithelial detachment. IRF - intraretinal fluid. SRF - subretinal fluid. EZ - ellipsoid zone. ERM - epiretinal membrane. ORA - outer retinal atrophy. ORT - outer retinal tubulation. SRHM - subretinal hyper-reflective material            ASSESSMENT/PLAN:    ICD-10-CM   1. Advanced atrophic nonexudative age-related macular degeneration of both eyes with subfoveal involvement  H35.3134 OCT, Retina - OU - Both Eyes    2. Essential hypertension  I10     3. Hypertensive retinopathy of both eyes  H35.033     4. Pseudophakia of both eyes  Z96.1     5. Retinal edema  H35.81      1. Age related macular degeneration,  non-exudative, both eyes  - advanced atrophic stage w/ +ORA and GA OU  - focal linear IRH inferior macula OS noted on exam, 01.28.22 -- stably resolved, no heme  - no fluid or edema on OCT or exam  - BCVA OD: 20/100, OS: CF@3 ' -- both decreased from 20/60  - OCT shows OD: Interval progression of central GA seen best on en face imaging, mild ERM and pucker -- stable; OS: Mild progression of ORA/GA subfovea   - pt reports continued smoking of 1/2 ppd -- discussed smoking risks related to ARMD and advised cessation of smoking  - pt reports that she has stopped driving  - Recommend amsler grid monitoring  - [facility]  - f/u 6-9 months, sooner prn -- DFE, OCT  2,3. Hypertensive retinopathy OU  - discussed importance of tight BP control  - monitor  4. Pseudophakia OU  - s/p CE/IOL OU (Dr. Vonna Kotyk, 2018)  - IOLs in good position, doing well  - monitor  Ophthalmic Meds Ordered this visit:  No orders of the defined types were placed in this encounter.  This document serves as a record of services personally performed by Karie Chimera, MD, PhD. It was created on their behalf by Berlin Hun COT, an ophthalmic technician. The creation of this record is the provider's dictation and/or activities during the visit.    Electronically signed by: Berlin Hun COT 07.25.24 3:47 PM  This document serves as a record of services personally performed by Karie Chimera, MD, PhD. It was created on their behalf by Glee Arvin. Manson Passey, OA an  ophthalmic technician. The creation of this record is the provider's dictation and/or activities during the visit.    Electronically signed by: Glee Arvin. Manson Passey, OA 02/12/23 3:47 PM  Karie Chimera, M.D., Ph.D. Diseases & Surgery of the Retina and Vitreous [facility] 02/12/2023   I have reviewed the above documentation for accuracy and completeness, and I agree with the above. Karie Chimera, M.D., Ph.D. 02/12/23 3:48 PM   Abbreviations: M myopia (nearsighted); A astigmatism; H hyperopia (farsighted); P presbyopia; Mrx spectacle prescription;  CTL contact lenses; OD right eye; OS left eye; OU both eyes  XT exotropia; ET esotropia; PEK punctate epithelial keratitis; PEE punctate epithelial erosions; DES dry eye syndrome; MGD meibomian gland dysfunction; ATs artificial tears; PFAT's preservative free artificial tears; NSC nuclear sclerotic cataract; PSC posterior subcapsular cataract; ERM epi-retinal membrane; PVD posterior vitreous detachment; RD retinal detachment; DM diabetes mellitus; DR diabetic retinopathy; NPDR non-proliferative diabetic retinopathy; PDR proliferative diabetic retinopathy; CSME clinically significant macular edema; DME diabetic macular edema; dbh dot blot hemorrhages; CWS cotton wool spot; POAG primary open angle glaucoma; C/D cup-to-disc ratio; HVF humphrey visual field; GVF goldmann visual field; OCT optical coherence tomography; IOP intraocular pressure; BRVO Branch retinal vein occlusion; CRVO central retinal vein occlusion; CRAO central retinal artery occlusion; BRAO branch retinal artery occlusion; RT retinal tear; SB scleral buckle; PPV pars plana vitrectomy; VH Vitreous hemorrhage; PRP panretinal laser photocoagulation; IVK intravitreal kenalog; VMT vitreomacular traction; MH Macular hole;  NVD neovascularization of the disc; NVE neovascularization elsewhere; AREDS age related eye disease study; ARMD age related macular degeneration; POAG primary open angle glaucoma; EBMD epithelial/anterior basement membrane dystrophy; ACIOL anterior chamber intraocular lens; IOL intraocular lens; PCIOL posterior chamber intraocular lens; Phaco/IOL phacoemulsification with intraocular lens placement; PRK photorefractive keratectomy; LASIK laser assisted in situ keratomileusis; HTN hypertension; DM diabetes mellitus; COPD chronic obstructive pulmonary disease.

## 2023-02-12 ENCOUNTER — Ambulatory Visit (INDEPENDENT_AMBULATORY_CARE_PROVIDER_SITE_OTHER): Payer: Medicare PPO | Admitting: Ophthalmology

## 2023-02-12 ENCOUNTER — Encounter (INDEPENDENT_AMBULATORY_CARE_PROVIDER_SITE_OTHER): Payer: Self-pay | Admitting: Ophthalmology

## 2023-02-12 DIAGNOSIS — I10 Essential (primary) hypertension: Secondary | ICD-10-CM

## 2023-02-12 DIAGNOSIS — H353134 Nonexudative age-related macular degeneration, bilateral, advanced atrophic with subfoveal involvement: Secondary | ICD-10-CM | POA: Diagnosis not present

## 2023-02-12 DIAGNOSIS — H35033 Hypertensive retinopathy, bilateral: Secondary | ICD-10-CM

## 2023-02-12 DIAGNOSIS — Z961 Presence of intraocular lens: Secondary | ICD-10-CM

## 2023-02-12 DIAGNOSIS — H3581 Retinal edema: Secondary | ICD-10-CM

## 2023-03-04 ENCOUNTER — Ambulatory Visit: Payer: Medicare PPO | Admitting: Podiatry

## 2023-03-05 ENCOUNTER — Emergency Department (HOSPITAL_COMMUNITY): Payer: Medicare PPO

## 2023-03-05 ENCOUNTER — Inpatient Hospital Stay (HOSPITAL_COMMUNITY)
Admission: EM | Admit: 2023-03-05 | Discharge: 2023-03-08 | DRG: 190 | Disposition: A | Discharge status: Home / Self Care | Payer: Medicare PPO | Attending: Internal Medicine | Admitting: Internal Medicine

## 2023-03-05 ENCOUNTER — Other Ambulatory Visit: Payer: Self-pay

## 2023-03-05 ENCOUNTER — Encounter (HOSPITAL_COMMUNITY): Payer: Self-pay

## 2023-03-05 DIAGNOSIS — M1990 Unspecified osteoarthritis, unspecified site: Secondary | ICD-10-CM | POA: Diagnosis present

## 2023-03-05 DIAGNOSIS — Z79899 Other long term (current) drug therapy: Secondary | ICD-10-CM

## 2023-03-05 DIAGNOSIS — J441 Chronic obstructive pulmonary disease with (acute) exacerbation: Principal | ICD-10-CM | POA: Diagnosis present

## 2023-03-05 DIAGNOSIS — G9389 Other specified disorders of brain: Secondary | ICD-10-CM | POA: Diagnosis not present

## 2023-03-05 DIAGNOSIS — J449 Chronic obstructive pulmonary disease, unspecified: Secondary | ICD-10-CM | POA: Diagnosis not present

## 2023-03-05 DIAGNOSIS — I129 Hypertensive chronic kidney disease with stage 1 through stage 4 chronic kidney disease, or unspecified chronic kidney disease: Secondary | ICD-10-CM | POA: Diagnosis present

## 2023-03-05 DIAGNOSIS — R0600 Dyspnea, unspecified: Secondary | ICD-10-CM | POA: Diagnosis not present

## 2023-03-05 DIAGNOSIS — K5900 Constipation, unspecified: Secondary | ICD-10-CM | POA: Diagnosis present

## 2023-03-05 DIAGNOSIS — E7800 Pure hypercholesterolemia, unspecified: Secondary | ICD-10-CM | POA: Diagnosis present

## 2023-03-05 DIAGNOSIS — Z8673 Personal history of transient ischemic attack (TIA), and cerebral infarction without residual deficits: Secondary | ICD-10-CM | POA: Diagnosis not present

## 2023-03-05 DIAGNOSIS — G9341 Metabolic encephalopathy: Secondary | ICD-10-CM | POA: Diagnosis present

## 2023-03-05 DIAGNOSIS — H353130 Nonexudative age-related macular degeneration, bilateral, stage unspecified: Secondary | ICD-10-CM | POA: Diagnosis present

## 2023-03-05 DIAGNOSIS — I6782 Cerebral ischemia: Secondary | ICD-10-CM | POA: Diagnosis not present

## 2023-03-05 DIAGNOSIS — Z1152 Encounter for screening for COVID-19: Secondary | ICD-10-CM | POA: Diagnosis not present

## 2023-03-05 DIAGNOSIS — Z7951 Long term (current) use of inhaled steroids: Secondary | ICD-10-CM | POA: Diagnosis not present

## 2023-03-05 DIAGNOSIS — Z9842 Cataract extraction status, left eye: Secondary | ICD-10-CM

## 2023-03-05 DIAGNOSIS — F17210 Nicotine dependence, cigarettes, uncomplicated: Secondary | ICD-10-CM | POA: Diagnosis present

## 2023-03-05 DIAGNOSIS — J9601 Acute respiratory failure with hypoxia: Secondary | ICD-10-CM | POA: Diagnosis present

## 2023-03-05 DIAGNOSIS — E559 Vitamin D deficiency, unspecified: Secondary | ICD-10-CM | POA: Diagnosis present

## 2023-03-05 DIAGNOSIS — R7989 Other specified abnormal findings of blood chemistry: Secondary | ICD-10-CM | POA: Diagnosis present

## 2023-03-05 DIAGNOSIS — R0981 Nasal congestion: Secondary | ICD-10-CM | POA: Diagnosis not present

## 2023-03-05 DIAGNOSIS — H35033 Hypertensive retinopathy, bilateral: Secondary | ICD-10-CM | POA: Diagnosis present

## 2023-03-05 DIAGNOSIS — I712 Thoracic aortic aneurysm, without rupture, unspecified: Secondary | ICD-10-CM | POA: Diagnosis present

## 2023-03-05 DIAGNOSIS — N1831 Chronic kidney disease, stage 3a: Secondary | ICD-10-CM | POA: Diagnosis present

## 2023-03-05 DIAGNOSIS — Z9841 Cataract extraction status, right eye: Secondary | ICD-10-CM

## 2023-03-05 DIAGNOSIS — Z7982 Long term (current) use of aspirin: Secondary | ICD-10-CM

## 2023-03-05 DIAGNOSIS — E876 Hypokalemia: Secondary | ICD-10-CM | POA: Diagnosis present

## 2023-03-05 DIAGNOSIS — R4182 Altered mental status, unspecified: Secondary | ICD-10-CM | POA: Diagnosis not present

## 2023-03-05 DIAGNOSIS — I6523 Occlusion and stenosis of bilateral carotid arteries: Secondary | ICD-10-CM | POA: Diagnosis not present

## 2023-03-05 DIAGNOSIS — K219 Gastro-esophageal reflux disease without esophagitis: Secondary | ICD-10-CM | POA: Diagnosis present

## 2023-03-05 DIAGNOSIS — Z95828 Presence of other vascular implants and grafts: Secondary | ICD-10-CM | POA: Diagnosis not present

## 2023-03-05 DIAGNOSIS — R059 Cough, unspecified: Secondary | ICD-10-CM | POA: Diagnosis not present

## 2023-03-05 DIAGNOSIS — R918 Other nonspecific abnormal finding of lung field: Secondary | ICD-10-CM | POA: Diagnosis not present

## 2023-03-05 DIAGNOSIS — N179 Acute kidney failure, unspecified: Secondary | ICD-10-CM | POA: Diagnosis present

## 2023-03-05 DIAGNOSIS — Z90710 Acquired absence of both cervix and uterus: Secondary | ICD-10-CM | POA: Diagnosis not present

## 2023-03-05 DIAGNOSIS — R0602 Shortness of breath: Secondary | ICD-10-CM | POA: Diagnosis present

## 2023-03-05 LAB — BASIC METABOLIC PANEL
Anion gap: 14 (ref 5–15)
BUN: 16 mg/dL (ref 8–23)
CO2: 27 mmol/L (ref 22–32)
Calcium: 9.1 mg/dL (ref 8.9–10.3)
Chloride: 98 mmol/L (ref 98–111)
Creatinine, Ser: 1.31 mg/dL — ABNORMAL HIGH (ref 0.44–1.00)
GFR, Estimated: 40 mL/min — ABNORMAL LOW (ref >60)
Glucose, Bld: 192 mg/dL — ABNORMAL HIGH (ref 70–99)
Potassium: 3.5 mmol/L (ref 3.5–5.1)
Sodium: 139 mmol/L (ref 135–145)

## 2023-03-05 LAB — CBC
HCT: 44.1 % (ref 36.0–46.0)
Hemoglobin: 13.9 g/dL (ref 12.0–15.0)
MCH: 29.3 pg (ref 26.0–34.0)
MCHC: 31.5 g/dL (ref 30.0–36.0)
MCV: 93 fL (ref 80.0–100.0)
Platelets: 228 10*3/uL (ref 150–400)
RBC: 4.74 MIL/uL (ref 3.87–5.11)
RDW: 12.9 % (ref 11.5–15.5)
WBC: 8.5 10*3/uL (ref 4.0–10.5)
nRBC: 0 % (ref 0.0–0.2)

## 2023-03-05 LAB — I-STAT VENOUS BLOOD GAS, ED
Acid-Base Excess: 5 mmol/L — ABNORMAL HIGH (ref 0.0–2.0)
Bicarbonate: 31 mmol/L — ABNORMAL HIGH (ref 20.0–28.0)
Calcium, Ion: 1.09 mmol/L — ABNORMAL LOW (ref 1.15–1.40)
HCT: 40 % (ref 36.0–46.0)
Hemoglobin: 13.6 g/dL (ref 12.0–15.0)
O2 Saturation: 84 %
Potassium: 3.3 mmol/L — ABNORMAL LOW (ref 3.5–5.1)
Sodium: 140 mmol/L (ref 135–145)
TCO2: 33 mmol/L — ABNORMAL HIGH (ref 22–32)
pCO2, Ven: 50.6 mmHg (ref 44–60)
pH, Ven: 7.396 (ref 7.25–7.43)
pO2, Ven: 50 mmHg — ABNORMAL HIGH (ref 32–45)

## 2023-03-05 LAB — TROPONIN I (HIGH SENSITIVITY)
Troponin I (High Sensitivity): 8 ng/L (ref <18)
Troponin I (High Sensitivity): 8 ng/L (ref <18)

## 2023-03-05 LAB — BRAIN NATRIURETIC PEPTIDE: B Natriuretic Peptide: 83.1 pg/mL (ref 0.0–100.0)

## 2023-03-05 LAB — D-DIMER, QUANTITATIVE: D-Dimer, Quant: 0.83 ug{FEU}/mL — ABNORMAL HIGH (ref 0.00–0.50)

## 2023-03-05 MED ORDER — IPRATROPIUM-ALBUTEROL 0.5-2.5 (3) MG/3ML IN SOLN
3.0000 mL | Freq: Once | RESPIRATORY_TRACT | Status: AC
  Administered 2023-03-05: 3 mL via RESPIRATORY_TRACT
  Filled 2023-03-05: qty 3

## 2023-03-05 MED ORDER — METHYLPREDNISOLONE SODIUM SUCC 125 MG IJ SOLR
125.0000 mg | Freq: Once | INTRAMUSCULAR | Status: AC
  Administered 2023-03-05: 125 mg via INTRAVENOUS
  Filled 2023-03-05: qty 2

## 2023-03-05 NOTE — H&P (Incomplete)
History and Physical   [facility] [place] @ [facility] Admission History and Physical [employer], D.O.    Patient Name: Chelsey Young MR#: [id number] Date of Birth: 06-04-1941 Date of Admission: 03/05/2023  Referring MD/NP/PA: Dr. Mcneil Sober Primary Care Physician: Merri Brunette, MD  Chief Complaint:  Chief Complaint  Patient presents with   Shortness of Breath   Leg Pain   Cough    HPI: Chelsey Young is a 82 y.o. female with a known history of osteoarthritis, COPD, hypertension, hyperlipidemia, CVA presents to the emergency department for evaluation of shortness of breath.  Patient was in a usual state of health until ***.  Patient denies fevers/chills, weakness, dizziness, chest pain, shortness of breath, N/V/C/D, abdominal pain, dysuria/frequency, changes in mental status.    Otherwise there has been no change in status. Patient has been taking medication as prescribed and there has been no recent change in medication or diet.  No recent antibiotics.  There has been no recent illness, hospitalizations, travel or sick contacts.    EMS/ED Course: Patient received prednisolone and DuoNeb. Medical admission has been requested for further management of acute hypoxic respiratory failure secondary to COPD exacerbation.  Review of Systems:  CONSTITUTIONAL: No fever/chills, fatigue, weakness, weight gain/loss, headache. EYES: No blurry or double vision. ENT: No tinnitus, postnasal drip, redness or soreness of the oropharynx. RESPIRATORY: No cough, dyspnea, wheeze.  No hemoptysis.  CARDIOVASCULAR: No chest pain, palpitations, syncope, orthopnea. No lower extremity edema.  GASTROINTESTINAL: No nausea, vomiting, abdominal pain, diarrhea, constipation.  No hematemesis, melena or hematochezia. GENITOURINARY: No dysuria, frequency, hematuria. ENDOCRINE: No polyuria or nocturia. No heat or cold intolerance. HEMATOLOGY: No anemia, bruising, bleeding. INTEGUMENTARY: No  rashes, ulcers, lesions. MUSCULOSKELETAL: No arthritis, gout. NEUROLOGIC: No numbness, tingling, ataxia, seizure-type activity, weakness. PSYCHIATRIC: No anxiety, depression, insomnia.   Past Medical History:  Diagnosis Date   Arthritis    Cancer (HCC)    CERVICAL   COPD (chronic obstructive pulmonary disease) (HCC)    Hypercholesteremia    Hypertension    Hypertensive retinopathy    OU   Hypokalemia    Macular degeneration    Non-exu OU   Stroke [facility])    TIA   Thoracic aortic aneurysm without rupture (HCC) 10/29/2016   PER CT CHEST   Vitamin D deficiency     Past Surgical History:  Procedure Laterality Date   ABDOMINAL AORTIC ENDOVASCULAR STENT GRAFT N/A 02/29/2020   Procedure: ABDOMINAL AORTIC ENDOVASCULAR STENT GRAFT;  Surgeon: Cephus Shelling, MD;  Location: [facility] OR;  Service: Vascular;  Laterality: N/A;   ABDOMINAL HYSTERECTOMY     WITH BSO   CATARACT EXTRACTION Bilateral 2018   Dr. Vonna Kotyk   CHOLECYSTECTOMY     EYE SURGERY Bilateral 2018   Cat Sx - Dr. Vonna Kotyk     reports that she has been smoking cigarettes. She has a 30 pack-year smoking history. She has never used smokeless tobacco. She reports that she does not drink alcohol and does not use drugs.  No Known Allergies  Family History  Problem Relation Age of Onset   Myasthenia gravis Daughter    Epilepsy Son     Prior to Admission medications   Medication Sig Start Date End Date Taking? Authorizing Provider  albuterol (PROVENTIL) (2.5 MG/3ML) 0.083% nebulizer solution Take 2.5 mg by nebulization every 6 (six) hours as needed for wheezing or shortness of breath.    [provider]  aspirin 81 MG tablet Take 81 mg by mouth daily.  [provider]  calcitonin, salmon, (MIACALCIN/FORTICAL) 200 UNIT/ACT nasal spray SMARTSIG:Both Nares 12/19/19   [provider]  calcium carbonate (OSCAL) 1500 (600 Ca) MG TABS tablet 1 tablet with meals    [provider]  Cholecalciferol  (VITAMIN D3) 5000 units CAPS Take 5,000 Units by mouth daily.     [provider]  fluticasone furoate-vilanterol (BREO ELLIPTA) 100-25 MCG/INH AEPB INHALE 1 PUFF ONCE DAILY    [provider]  Multiple Vitamins-Minerals (PRESERVISION AREDS 2+MULTI VIT PO) Take 1 capsule by mouth in the morning and at bedtime.    [provider]  omeprazole (PRILOSEC) 20 MG capsule 1 capsule 30 minutes before morning meal 12/10/20   [provider]  Potassium 99 MG TABS Take 2 tablets by mouth daily.    [provider]  potassium chloride (KLOR-CON) 10 MEQ tablet 1 tablet 05/30/17   [provider]  simvastatin (ZOCOR) 40 MG tablet Take 40 mg by mouth every evening.     [provider]  TRELEGY ELLIPTA 100-62.5-25 MCG/INH AEPB Inhale 1 puff into the lungs daily. 01/29/20   [provider]  triamterene-hydrochlorothiazide (DYAZIDE) 37.5-25 MG capsule Take 1 capsule by mouth daily.    [provider]  valACYclovir (VALTREX) 1000 MG tablet Take by mouth. 02/27/20   [provider]    Physical Exam: Vitals:   03/05/23 2035 03/05/23 2035 03/05/23 2100 03/05/23 2130  BP:   (!) 152/80 (!) 140/75  Pulse:   78 74  Resp:   17 (!) 25  Temp: 98.1 F (36.7 C)     TempSrc:      SpO2:   96% 95%  Weight:  52.2 kg    Height:  5\' 2"  (1.575 m)      GENERAL: 82 y.o.-year-old white female patient, well-developed, well-nourished lying in the bed in no acute distress.  Pleasant and cooperative.   HEENT: Head atraumatic, normocephalic. Pupils equal. Mucus membranes moist. NECK: Supple. No JVD. CHEST: Normal breath sounds bilaterally. No wheezing, rales, rhonchi or crackles. No use of accessory muscles of respiration.  No reproducible chest wall tenderness.  CARDIOVASCULAR: S1, S2 normal. No murmurs, rubs, or gallops. Cap refill <2 seconds. Pulses intact distally.  ABDOMEN: Soft, nondistended, nontender. No rebound, guarding, rigidity.  Normoactive bowel sounds present in all four quadrants.  EXTREMITIES: No pedal edema, cyanosis, or clubbing. No calf tenderness or Homan's sign.  NEUROLOGIC: The patient is alert and oriented x 3. Cranial nerves II through XII are grossly intact with no focal sensorimotor deficit. PSYCHIATRIC:  Normal affect, mood, thought content. SKIN: Warm, dry, and intact without obvious rash, lesion, or ulcer.    Labs on Admission:  CBC: Recent Labs  Lab 03/05/23 1835 03/05/23 2152  WBC 8.5  --   HGB 13.9 13.6  HCT 44.1 40.0  MCV 93.0  --   PLT 228  --    Basic Metabolic Panel: Recent Labs  Lab 03/05/23 1835 03/05/23 2152  NA 139 140  K 3.5 3.3*  CL 98  --   CO2 27  --   GLUCOSE 192*  --   BUN 16  --   CREATININE 1.31*  --   CALCIUM 9.1  --    GFR: Estimated Creatinine Clearance: 25.3 mL/min (A) (by C-G formula based on SCr of 1.31 mg/dL (H)). Liver Function Tests: No results for input(s): "AST", "ALT", "ALKPHOS", "BILITOT", "PROT", "ALBUMIN" in the last 168 hours. No results for input(s): "LIPASE", "AMYLASE" in the last 168 hours.  No results for input(s): "AMMONIA" in the last 168 hours. Coagulation Profile: No results for input(s): "INR", "PROTIME" in the last 168 hours. Cardiac Enzymes: No results for input(s): "CKTOTAL", "CKMB", "CKMBINDEX", "TROPONINI" in the last 168 hours. BNP (last 3 results) No results for input(s): "PROBNP" in the last 8760 hours. HbA1C: No results for input(s): "HGBA1C" in the last 72 hours. CBG: No results for input(s): "GLUCAP" in the last 168 hours. Lipid Profile: No results for input(s): "CHOL", "HDL", "LDLCALC", "TRIG", "CHOLHDL", "LDLDIRECT" in the last 72 hours. Thyroid Function Tests: No results for input(s): "TSH", "T4TOTAL", "FREET4", "T3FREE", "THYROIDAB" in the last 72 hours. Anemia Panel: No results for input(s): "VITAMINB12", "FOLATE", "FERRITIN", "TIBC", "IRON", "RETICCTPCT" in the last 72 hours. Urine analysis:    Component  Value Date/Time   COLORURINE YELLOW 02/20/2020 1158   APPEARANCEUR CLEAR 02/20/2020 1158   LABSPEC 1.013 02/20/2020 1158   PHURINE 6.0 02/20/2020 1158   GLUCOSEU NEGATIVE 02/20/2020 1158   HGBUR NEGATIVE 02/20/2020 1158   BILIRUBINUR NEGATIVE 02/20/2020 1158   KETONESUR NEGATIVE 02/20/2020 1158   PROTEINUR NEGATIVE 02/20/2020 1158   NITRITE NEGATIVE 02/20/2020 1158   LEUKOCYTESUR NEGATIVE 02/20/2020 1158   Sepsis Labs: @LABRCNTIP (procalcitonin:4,lacticidven:4) )No results found for this or any previous visit (from the past 240 hour(s)).   Radiological Exams on Admission: DG Chest 2 View  Result Date: 03/05/2023 CLINICAL DATA:  Dyspnea EXAM: CHEST - 2 VIEW COMPARISON:  05/09/2013 chest radiograph. FINDINGS: Stable cardiomediastinal silhouette with normal heart size. No pneumothorax. No pleural effusion. Mildly hyperinflated lungs. No pulmonary edema. No acute consolidative airspace disease. Partially visualized abdominal aortic stent graft. Chronic severe L1 vertebral compression fracture. IMPRESSION: Mildly hyperinflated lungs, cannot exclude COPD. Otherwise no active cardiopulmonary disease. Electronically Signed   By: Delbert Phenix M.D.   On: 03/05/2023 19:31    EKG: Normal sinus rhythm at 91 bpm with normal axis and nonspecific ST-T wave changes.   Assessment/Plan  This is a 82 y.o. female with a history of osteoarthritis, COPD, hypertension, hyperlipidemia, CVA  now being admitted with:  #. Acute hypoxic respiratory failure 2/2 exacerbation of COPD - Admit obs - IV steroids and azithromycin - Nebulizers, O2 therapy and expectorants as needed.  -Continue Breo Ellipta - Check Dimer and CTA if positive - Continuous pulse oximetry - Consider pulmonary consult if not improving.  . #. AMS with history of CVA - Check head CT -Continue aspirin  #. Acute kidney injury  - IV fluids and repeat BMP in AM.  - Avoid nephrotoxic medications  #.  Mild hypokalemia  - Replace  orally - Check magnesium  #. History of OA - Pain control PRN  #. History of HTN - Continue Dyazide  #. History of HLD - Continue simvastatin  #. History of GERD -Continue omeprazole  Admission status: Obs IV Fluids: HL Diet/Nutrition: Heart healthy Consults called: None  DVT Px: Lovenox, SCDs and early ambulation. Code Status: Full Code  Disposition Plan: To home in 1-2 days  All the records are reviewed and case discussed with ED provider. Management plans discussed with the patient and/or family who express understanding and agree with plan of care.  Lella Mullany D.O. on 03/05/2023 at 10:55 PM CC: Primary care physician; Merri Brunette, MD   03/05/2023, 10:55 PM

## 2023-03-05 NOTE — ED Triage Notes (Signed)
Pt came in via POV from [facility] d/t her O2 levels were in the mid 80's to 90% on RA & son at bedside stated that pt has been not feeling well the last few weeks but has been having a chronic cough the past few yrs plus most recently noted she was very lethargic & c/o bil leg pain with a Hx of smoking.

## 2023-03-05 NOTE — ED Provider Notes (Signed)
[place] EMERGENCY DEPARTMENT AT [facility] Provider Note  MDM   HPI/ROS:  Chelsey Young is a 82 y.o. female with past medical history of COPD presenting with chief complaint of dyspnea and altered mental status.  Earlier today, patient's daughter-in-law came to check on her, and noted that she was profoundly dyspneic and altered, could not remember what she had eaten earlier that day.  This is especially abnormal for her.  Family took patient to urgent care, where providers noted that her sats were in the low 80s.  Patient was given a DuoNeb with significant improvement in work of breathing and oxygen saturations.  She was sent to the emergency department for further evaluation.  Physical exam is notable for: - Chronically ill-appearing - Mild wheezing on pulmonary auscultation with significantly prolonged expiratory phase  On my initial evaluation, patient is:  -Vital signs stable on 2L nasal cannula. Patient afebrile, hemodynamically stable, and non-toxic appearing. -Additional history obtained from son at bedside  Given the patient's history and physical exam, differential diagnosis includes but is not limited to COPD exacerbation, pneumonia, viral infection, etc. exceedingly low concern for pulmonary embolism given that patient is vitally stable and has not experienced any  Interpretations, interventions, and the patient's course of care are documented below.    Initial workup to include EKG, CBC, BMP, VBG, BNP, troponin, chest x-ray.  EKG normal sinus rhythm with no ST changes, interval disturbances, conduction blocks.  Overall reassuring.  Labs resulted overall within normal limits.  Creatinine of 1.3, relatively baseline per chart review.  VBG resulted with normal pH, pCO2 50.  Chest x-ray with no obvious consolidations or infectious change.  Single DuoNeb administered given prolonged expiratory phase with mild wheezes.  Solu-Medrol administered.  No necessity for  antibiotics at this time given the patient has not experienced an increase in productiveness of cough, has not had any infectious symptoms or fevers, and chest x-ray is negative.  Patient was trialed off of supplemental oxygen and desatted with oxygen saturations in the low 80s.  Hospitalist paged for admission.  Please see hospitalist provider notes for further details.  Disposition:  I discussed the case with hospitalist medicine who graciously agreed to admit the patient to their service for continued care.   Clinical Impression: No diagnosis found.  Rx / DC Orders ED Discharge Orders     None       The plan for this patient was discussed with Dr. Suezanne Jacquet, who voiced agreement and who oversaw evaluation and treatment of this patient.   Clinical Complexity A medically appropriate history, review of systems, and physical exam was performed.  My independent interpretations of EKG, labs, and radiology are documented in the ED course above.   Click here for ABCD2, HEART and other calculatorsREFRESH Note before signing   Patient's presentation is most consistent with acute presentation with potential threat to life or bodily function.  Medical Decision Making Amount and/or Complexity of Data Reviewed Labs: ordered. Radiology: ordered.  Risk Prescription drug management. Decision regarding hospitalization.    HPI/ROS      See MDM section for pertinent HPI and ROS. A complete ROS was performed with pertinent positives/negatives noted above.   Past Medical History:  Diagnosis Date   Arthritis    Cancer (HCC)    CERVICAL   COPD (chronic obstructive pulmonary disease) (HCC)    Hypercholesteremia    Hypertension    Hypertensive retinopathy    OU   Hypokalemia    Macular degeneration  Non-exu OU   Stroke [facility])    TIA   Thoracic aortic aneurysm without rupture (HCC) 10/29/2016   PER CT CHEST   Vitamin D deficiency     Past Surgical History:  Procedure  Laterality Date   ABDOMINAL AORTIC ENDOVASCULAR STENT GRAFT N/A 02/29/2020   Procedure: ABDOMINAL AORTIC ENDOVASCULAR STENT GRAFT;  Surgeon: Cephus Shelling, MD;  Location: [facility] OR;  Service: Vascular;  Laterality: N/A;   ABDOMINAL HYSTERECTOMY     WITH BSO   CATARACT EXTRACTION Bilateral 2018   Dr. Vonna Kotyk   CHOLECYSTECTOMY     EYE SURGERY Bilateral 2018   Cat Sx - Dr. Vonna Kotyk      Physical Exam   Vitals:   03/05/23 2100 03/05/23 2130 03/05/23 2230 03/05/23 2319  BP: (!) 152/80 (!) 140/75 98/81 (!) 143/67  Pulse: 78 74 84 83  Resp: 17 (!) 25 (!) 26 18  Temp:      TempSrc:      SpO2: 96% 95% 93% 92%  Weight:      Height:        Physical Exam Vitals and nursing note reviewed.  Constitutional:      General: She is not in acute distress.    Appearance: She is well-developed. She is ill-appearing.  HENT:     Head: Normocephalic and atraumatic.  Eyes:     Conjunctiva/sclera: Conjunctivae normal.  Cardiovascular:     Rate and Rhythm: Normal rate and regular rhythm.     Heart sounds: No murmur heard. Pulmonary:     Effort: Pulmonary effort is normal. No tachypnea or respiratory distress.     Breath sounds: Examination of the right-middle field reveals wheezing. Examination of the left-middle field reveals wheezing. Wheezing present.  Abdominal:     Palpations: Abdomen is soft.     Tenderness: There is no abdominal tenderness.  Musculoskeletal:        General: No swelling.     Cervical back: Neck supple.  Skin:    General: Skin is warm and dry.  Neurological:     General: No focal deficit present.     Mental Status: She is alert and oriented to person, place, and time.     Cranial Nerves: No cranial nerve deficit.     Motor: No weakness.  Psychiatric:        Mood and Affect: Mood normal.     Starleen Arms, MD Department of Emergency Medicine   Please note that this documentation was produced with the assistance of voice-to-text technology and may contain errors.     Dyanne Iha, MD 03/05/23 2340    Lonell Grandchild, MD 03/06/23 1508    Lonell Grandchild, MD 03/06/23 [phone]

## 2023-03-06 ENCOUNTER — Inpatient Hospital Stay (HOSPITAL_COMMUNITY): Payer: Medicare PPO

## 2023-03-06 DIAGNOSIS — Z9841 Cataract extraction status, right eye: Secondary | ICD-10-CM | POA: Diagnosis not present

## 2023-03-06 DIAGNOSIS — Z90710 Acquired absence of both cervix and uterus: Secondary | ICD-10-CM | POA: Diagnosis not present

## 2023-03-06 DIAGNOSIS — J441 Chronic obstructive pulmonary disease with (acute) exacerbation: Secondary | ICD-10-CM | POA: Diagnosis present

## 2023-03-06 DIAGNOSIS — Z7951 Long term (current) use of inhaled steroids: Secondary | ICD-10-CM | POA: Diagnosis not present

## 2023-03-06 DIAGNOSIS — I712 Thoracic aortic aneurysm, without rupture, unspecified: Secondary | ICD-10-CM | POA: Diagnosis present

## 2023-03-06 DIAGNOSIS — G9341 Metabolic encephalopathy: Secondary | ICD-10-CM | POA: Diagnosis present

## 2023-03-06 DIAGNOSIS — I129 Hypertensive chronic kidney disease with stage 1 through stage 4 chronic kidney disease, or unspecified chronic kidney disease: Secondary | ICD-10-CM | POA: Diagnosis present

## 2023-03-06 DIAGNOSIS — N179 Acute kidney failure, unspecified: Secondary | ICD-10-CM | POA: Diagnosis present

## 2023-03-06 DIAGNOSIS — E7800 Pure hypercholesterolemia, unspecified: Secondary | ICD-10-CM | POA: Diagnosis present

## 2023-03-06 DIAGNOSIS — R0602 Shortness of breath: Secondary | ICD-10-CM | POA: Diagnosis present

## 2023-03-06 DIAGNOSIS — J9601 Acute respiratory failure with hypoxia: Secondary | ICD-10-CM | POA: Diagnosis present

## 2023-03-06 DIAGNOSIS — M1990 Unspecified osteoarthritis, unspecified site: Secondary | ICD-10-CM | POA: Diagnosis present

## 2023-03-06 DIAGNOSIS — N1831 Chronic kidney disease, stage 3a: Secondary | ICD-10-CM | POA: Diagnosis present

## 2023-03-06 DIAGNOSIS — K5900 Constipation, unspecified: Secondary | ICD-10-CM | POA: Diagnosis present

## 2023-03-06 DIAGNOSIS — Z8673 Personal history of transient ischemic attack (TIA), and cerebral infarction without residual deficits: Secondary | ICD-10-CM | POA: Diagnosis not present

## 2023-03-06 DIAGNOSIS — Z9842 Cataract extraction status, left eye: Secondary | ICD-10-CM | POA: Diagnosis not present

## 2023-03-06 DIAGNOSIS — F17210 Nicotine dependence, cigarettes, uncomplicated: Secondary | ICD-10-CM | POA: Diagnosis present

## 2023-03-06 DIAGNOSIS — H353130 Nonexudative age-related macular degeneration, bilateral, stage unspecified: Secondary | ICD-10-CM | POA: Diagnosis present

## 2023-03-06 DIAGNOSIS — Z1152 Encounter for screening for COVID-19: Secondary | ICD-10-CM | POA: Diagnosis not present

## 2023-03-06 DIAGNOSIS — Z7982 Long term (current) use of aspirin: Secondary | ICD-10-CM | POA: Diagnosis not present

## 2023-03-06 DIAGNOSIS — R7989 Other specified abnormal findings of blood chemistry: Secondary | ICD-10-CM | POA: Diagnosis present

## 2023-03-06 DIAGNOSIS — E876 Hypokalemia: Secondary | ICD-10-CM | POA: Diagnosis present

## 2023-03-06 DIAGNOSIS — E559 Vitamin D deficiency, unspecified: Secondary | ICD-10-CM | POA: Diagnosis present

## 2023-03-06 DIAGNOSIS — K219 Gastro-esophageal reflux disease without esophagitis: Secondary | ICD-10-CM | POA: Diagnosis present

## 2023-03-06 DIAGNOSIS — H35033 Hypertensive retinopathy, bilateral: Secondary | ICD-10-CM | POA: Diagnosis present

## 2023-03-06 LAB — RESP PANEL BY RT-PCR (RSV, FLU A&B, COVID)  RVPGX2
Influenza A by PCR: NEGATIVE
Influenza B by PCR: NEGATIVE
Resp Syncytial Virus by PCR: NEGATIVE
SARS Coronavirus 2 by RT PCR: NEGATIVE

## 2023-03-06 LAB — RESPIRATORY PANEL BY PCR

## 2023-03-06 LAB — URINALYSIS, ROUTINE W REFLEX MICROSCOPIC
Bilirubin Urine: NEGATIVE
Glucose, UA: NEGATIVE mg/dL
Hgb urine dipstick: NEGATIVE
Ketones, ur: NEGATIVE mg/dL
Leukocytes,Ua: NEGATIVE
Nitrite: NEGATIVE
Protein, ur: NEGATIVE mg/dL
Specific Gravity, Urine: 1.01 (ref 1.005–1.030)
pH: 6 (ref 5.0–8.0)

## 2023-03-06 LAB — BASIC METABOLIC PANEL
Anion gap: 16 — ABNORMAL HIGH (ref 5–15)
BUN: 17 mg/dL (ref 8–23)
CO2: 25 mmol/L (ref 22–32)
Calcium: 9 mg/dL (ref 8.9–10.3)
Chloride: 99 mmol/L (ref 98–111)
Creatinine, Ser: 1.34 mg/dL — ABNORMAL HIGH (ref 0.44–1.00)
GFR, Estimated: 39 mL/min — ABNORMAL LOW (ref >60)
Glucose, Bld: 190 mg/dL — ABNORMAL HIGH (ref 70–99)
Potassium: 3.5 mmol/L (ref 3.5–5.1)
Sodium: 140 mmol/L (ref 135–145)

## 2023-03-06 LAB — CBC
HCT: 43.2 % (ref 36.0–46.0)
Hemoglobin: 13.8 g/dL (ref 12.0–15.0)
MCH: 29.6 pg (ref 26.0–34.0)
MCHC: 31.9 g/dL (ref 30.0–36.0)
MCV: 92.7 fL (ref 80.0–100.0)
Platelets: 223 10*3/uL (ref 150–400)
RBC: 4.66 MIL/uL (ref 3.87–5.11)
RDW: 13.1 % (ref 11.5–15.5)
WBC: 6.5 10*3/uL (ref 4.0–10.5)
nRBC: 0 % (ref 0.0–0.2)

## 2023-03-06 LAB — MAGNESIUM: Magnesium: 2.3 mg/dL (ref 1.7–2.4)

## 2023-03-06 MED ORDER — METHYLPREDNISOLONE SODIUM SUCC 125 MG IJ SOLR
60.0000 mg | Freq: Three times a day (TID) | INTRAMUSCULAR | Status: DC
  Administered 2023-03-06 – 2023-03-07 (×2): 60 mg via INTRAVENOUS
  Filled 2023-03-06 (×2): qty 2

## 2023-03-06 MED ORDER — ACETAMINOPHEN 325 MG PO TABS: 650.0000 mg | ORAL_TABLET | Freq: Four times a day (QID) | ORAL | Status: DC | PRN

## 2023-03-06 MED ORDER — ENOXAPARIN SODIUM 60 MG/0.6ML IJ SOSY
1.0000 mg/kg | PREFILLED_SYRINGE | Freq: Once | INTRAMUSCULAR | Status: AC
  Administered 2023-03-06: 52.5 mg via SUBCUTANEOUS
  Filled 2023-03-06: qty 0.6

## 2023-03-06 MED ORDER — MUPIROCIN CALCIUM 2 % EX CREA
TOPICAL_CREAM | Freq: Two times a day (BID) | CUTANEOUS | Status: DC
  Filled 2023-03-06: qty 15

## 2023-03-06 MED ORDER — FLUTICASONE FUROATE-VILANTEROL 100-25 MCG/ACT IN AEPB
1.0000 | INHALATION_SPRAY | Freq: Every day | RESPIRATORY_TRACT | Status: DC
  Administered 2023-03-07: 1 via RESPIRATORY_TRACT
  Filled 2023-03-06 (×2): qty 28

## 2023-03-06 MED ORDER — AZITHROMYCIN 500 MG PO TABS
500.0000 mg | ORAL_TABLET | Freq: Every day | ORAL | Status: DC
  Administered 2023-03-07 – 2023-03-08 (×3): 500 mg via ORAL
  Filled 2023-03-06 (×3): qty 1

## 2023-03-06 MED ORDER — ENOXAPARIN SODIUM 40 MG/0.4ML IJ SOSY: 40.0000 mg | PREFILLED_SYRINGE | INTRAMUSCULAR | Status: DC

## 2023-03-06 MED ORDER — SIMVASTATIN 20 MG PO TABS
40.0000 mg | ORAL_TABLET | Freq: Every evening | ORAL | Status: DC
  Administered 2023-03-06 – 2023-03-07 (×2): 40 mg via ORAL
  Filled 2023-03-06 (×2): qty 2

## 2023-03-06 MED ORDER — SODIUM CHLORIDE 0.9% FLUSH
3.0000 mL | Freq: Two times a day (BID) | INTRAVENOUS | Status: DC
  Administered 2023-03-06 – 2023-03-08 (×5): 3 mL via INTRAVENOUS

## 2023-03-06 MED ORDER — VITAMIN D 25 MCG (1000 UNIT) PO TABS
5000.0000 [IU] | ORAL_TABLET | Freq: Every day | ORAL | Status: DC
  Administered 2023-03-06 – 2023-03-08 (×3): 5000 [IU] via ORAL
  Filled 2023-03-06 (×3): qty 5

## 2023-03-06 MED ORDER — SODIUM CHLORIDE 0.9 % IV SOLN
500.0000 mg | INTRAVENOUS | Status: AC
  Administered 2023-03-06: 500 mg via INTRAVENOUS
  Filled 2023-03-06: qty 5

## 2023-03-06 MED ORDER — GUAIFENESIN ER 600 MG PO TB12
600.0000 mg | ORAL_TABLET | Freq: Two times a day (BID) | ORAL | Status: DC
  Administered 2023-03-06 – 2023-03-08 (×6): 600 mg via ORAL
  Filled 2023-03-06 (×6): qty 1

## 2023-03-06 MED ORDER — POLYETHYLENE GLYCOL 3350 17 G PO PACK
17.0000 g | PACK | Freq: Every day | ORAL | Status: DC
  Administered 2023-03-07 – 2023-03-08 (×2): 17 g via ORAL
  Filled 2023-03-06 (×2): qty 1

## 2023-03-06 MED ORDER — ONDANSETRON HCL 4 MG PO TABS: 4.0000 mg | ORAL_TABLET | Freq: Four times a day (QID) | ORAL | Status: DC | PRN

## 2023-03-06 MED ORDER — PANTOPRAZOLE SODIUM 40 MG PO TBEC
40.0000 mg | DELAYED_RELEASE_TABLET | Freq: Every day | ORAL | Status: DC
  Administered 2023-03-07 – 2023-03-08 (×2): 40 mg via ORAL
  Filled 2023-03-06 (×3): qty 1

## 2023-03-06 MED ORDER — TRIAMTERENE-HCTZ 37.5-25 MG PO TABS
1.0000 | ORAL_TABLET | Freq: Every day | ORAL | Status: DC
  Filled 2023-03-06: qty 1

## 2023-03-06 MED ORDER — MORPHINE SULFATE (PF) 2 MG/ML IV SOLN: 1.0000 mg | Freq: Four times a day (QID) | INTRAVENOUS | Status: DC | PRN

## 2023-03-06 MED ORDER — HYDROCODONE-ACETAMINOPHEN 5-325 MG PO TABS: 1.0000 | ORAL_TABLET | ORAL | Status: DC | PRN

## 2023-03-06 MED ORDER — HYDRALAZINE HCL 20 MG/ML IJ SOLN: 5.0000 mg | Freq: Three times a day (TID) | INTRAMUSCULAR | Status: DC | PRN

## 2023-03-06 MED ORDER — BISACODYL 5 MG PO TBEC: 5.0000 mg | DELAYED_RELEASE_TABLET | Freq: Every day | ORAL | Status: DC | PRN

## 2023-03-06 MED ORDER — ASPIRIN 81 MG PO TBEC
81.0000 mg | DELAYED_RELEASE_TABLET | Freq: Every day | ORAL | Status: DC
  Administered 2023-03-07 – 2023-03-08 (×2): 81 mg via ORAL
  Filled 2023-03-06 (×3): qty 1

## 2023-03-06 MED ORDER — METHYLPREDNISOLONE SODIUM SUCC 125 MG IJ SOLR
60.0000 mg | Freq: Four times a day (QID) | INTRAMUSCULAR | Status: DC
  Filled 2023-03-06: qty 2

## 2023-03-06 MED ORDER — CALCIUM CARBONATE 1250 (500 CA) MG PO TABS
1250.0000 mg | ORAL_TABLET | Freq: Three times a day (TID) | ORAL | Status: DC
  Administered 2023-03-06 – 2023-03-08 (×5): 1250 mg via ORAL
  Filled 2023-03-06 (×6): qty 1

## 2023-03-06 MED ORDER — ACETAMINOPHEN 650 MG RE SUPP: 650.0000 mg | Freq: Four times a day (QID) | RECTAL | Status: DC | PRN

## 2023-03-06 MED ORDER — SENNOSIDES-DOCUSATE SODIUM 8.6-50 MG PO TABS: 1.0000 | ORAL_TABLET | Freq: Every evening | ORAL | Status: DC | PRN

## 2023-03-06 MED ORDER — METHYLPREDNISOLONE SODIUM SUCC 125 MG IJ SOLR
120.0000 mg | Freq: Two times a day (BID) | INTRAMUSCULAR | Status: DC
  Administered 2023-03-06: 120 mg via INTRAVENOUS
  Filled 2023-03-06: qty 2

## 2023-03-06 MED ORDER — HALOPERIDOL LACTATE 5 MG/ML IJ SOLN: 1.0000 mg | Freq: Four times a day (QID) | INTRAMUSCULAR | Status: DC | PRN

## 2023-03-06 MED ORDER — TRAZODONE HCL 50 MG PO TABS
25.0000 mg | ORAL_TABLET | Freq: Every evening | ORAL | Status: DC | PRN
  Administered 2023-03-07 (×2): 25 mg via ORAL
  Filled 2023-03-06 (×2): qty 1

## 2023-03-06 MED ORDER — CALCITONIN (SALMON) 200 UNIT/ACT NA SOLN
1.0000 | Freq: Every day | NASAL | Status: DC
  Filled 2023-03-06: qty 3.7

## 2023-03-06 MED ORDER — POTASSIUM CHLORIDE CRYS ER 10 MEQ PO TBCR
10.0000 meq | EXTENDED_RELEASE_TABLET | Freq: Every day | ORAL | Status: DC
  Administered 2023-03-06: 10 meq via ORAL
  Filled 2023-03-06: qty 1

## 2023-03-06 MED ORDER — IPRATROPIUM-ALBUTEROL 0.5-2.5 (3) MG/3ML IN SOLN: 3.0000 mL | Freq: Four times a day (QID) | RESPIRATORY_TRACT | Status: DC | PRN

## 2023-03-06 MED ORDER — ONDANSETRON HCL 4 MG/2ML IJ SOLN: 4.0000 mg | Freq: Four times a day (QID) | INTRAMUSCULAR | Status: DC | PRN

## 2023-03-06 NOTE — ED Notes (Signed)
Patient ambulated to the restroom with assistance from family

## 2023-03-06 NOTE — ED Notes (Signed)
Patient has been educated numerous times that she needs to leave her oxygen on. Patient still continuing to take oxygen off and her oxygen saturations drop into low to mid 80's

## 2023-03-06 NOTE — Progress Notes (Signed)
BLE venous duplex has been completed.   Results can be found under chart review under CV PROC. 03/06/2023 2:58 PM Karter Haire RVT, RDMS

## 2023-03-06 NOTE — ED Notes (Signed)
Patient transported to vascular ultrasound  

## 2023-03-06 NOTE — ED Notes (Signed)
Patient upset this morning, wants to leave, states provider and  staff hasn't been spending enough time with her, advised we had been in there multiple times, her O2 was placed in her nose 3 times, set up for breakfast, increased O2, breakfast tray thrown away, another chat about why she souldn't leave AMA due to her new oxygen requirements, med pass, med tech spoke to her, provider spoke to her, and assisted to the restroom.

## 2023-03-06 NOTE — ED Notes (Signed)
Oxygen saturation 78%-80%, RN turned oxygen up to 6L with no change. Patient still high 70's low 80's.  RN asked for MD to be paged.

## 2023-03-06 NOTE — Progress Notes (Signed)
Dr Janalyn Shy arrived at bedside to assess patient.

## 2023-03-06 NOTE — Progress Notes (Addendum)
Progress Note   Patient: Chelsey Young:[id number] DOB: [birth year] DOA: 03/05/2023     0 DOS: the patient was seen and examined on 03/06/2023   Brief hospital course: 82 y.o. female with a history of osteoarthritis, COPD, hypertension, hyperlipidemia, CVA  now being admitted with Acute hypoxic respiratory failure 2/2 exacerbation of COPD dimer elevated --ordered one weight based dose of Lovenox and bilateral LE dopplers.    Assessment and Plan:  82 y.o. female with a history of osteoarthritis, COPD, hypertension, hyperlipidemia, CVA  now being admitted with: Acute hypoxic respiratory failure 2/2 exacerbation of COPD -Change observation status to inpatient -Currently requiring O2 therapy at 3L/min - Continuous pulse oximetry, keep O2 90-93% -Continue IV azithromycin - Change IV Solumedrol to 60mg  Q8hrs - Nebulizers, O2 therapy and expectorants as needed.  -Continue Breo Ellipta - Unable to do CTA given low GFR at 39 and Cr at 1.34.Follow-up bilateral LE dopplers (received 1 dose of therapeutic Lovenox)  - Consider pulmonary consult if not improving.   AMS with history of CVA: Likely from hypoxia: Mental status improving -Reviewed CT head:  Generalized cerebral atrophy with chronic white matter small vessel ischemic changes. Chronic right parietooccipital infarct. No acute intracranial abnormality. - Obtain UA -Continue aspirin   Acute kidney injury: Creatinine stable at 1.31-->1.34 -Continue IV fluids - Avoid nephrotoxic medications   Mild hypokalemia: Replaced and resolved Magnesium at 2.3   History of osteoarthritis: pain control PRN   History of HTN: Controlled 118/64 this morning: Continue Dyazide   History of HLD: Continue simvastatin   History of GERD: Continue omeprazole   Admission status: Change observation status to inpatient status IV Fluids: HL Diet/Nutrition: Heart healthy Consults called: None  DVT Px: Lovenox, SCDs and early ambulation. Code  Status: Full Code  Disposition Plan: to home in 2 days       Subjective: Reported having shortness of breath, mild productive cough, reported constipation, lower abdominal pain  Denies fever, chills, nausea, vomiting, headache  Physical Exam: Vitals:   03/06/23 0034 03/06/23 0232 03/06/23 0340 03/06/23 0645  BP:  120/65 114/64 118/64  Pulse:  90 79 80  Resp:  18 16 15   Temp: 98.7 F (37.1 C) 98.3 F (36.8 C) 98.3 F (36.8 C) 98.1 F (36.7 C)  TempSrc: Axillary Oral Oral Oral  SpO2:  91% 93% 93%  Weight:      Height:       Physical Exam Constitutional:      General: She is not in acute distress.    Appearance: She is well-developed. She is ill-appearing.  HENT:     Head: Normocephalic.     Mouth/Throat:     Mouth: Mucous membranes are moist.     Pharynx: Oropharynx is clear.  Eyes:     Extraocular Movements: Extraocular movements intact.     Pupils: Pupils are equal, round, and reactive to light.  Cardiovascular:     Rate and Rhythm: Normal rate and regular rhythm.  Pulmonary:     Breath sounds: Examination of the right-upper field reveals wheezing. Examination of the left-upper field reveals wheezing. Examination of the right-middle field reveals wheezing. Examination of the left-middle field reveals wheezing. Examination of the right-lower field reveals wheezing. Examination of the left-lower field reveals wheezing. Wheezing present.  Abdominal:     Palpations: Abdomen is soft.     Comments: Mild tenderness in lower abdominal region- but also reports constipation  Musculoskeletal:        General: Normal range of motion.  Cervical back: Normal range of motion and neck supple.     Right lower leg: No edema.     Left lower leg: No edema.  Skin:    General: Skin is warm.     Capillary Refill: Capillary refill takes 2 to 3 seconds.     Findings: No erythema.  Neurological:     General: No focal deficit present.     Mental Status: She is alert.     Cranial  Nerves: No cranial nerve deficit.     Motor: No weakness.  Psychiatric:        Mood and Affect: Mood normal.        Behavior: Behavior normal.     Data Reviewed:  Latest Reference Range & Units 03/06/23 03:25  Sodium 135 - 145 mmol/L 140  Potassium 3.5 - 5.1 mmol/L 3.5  Chloride 98 - 111 mmol/L 99  CO2 22 - 32 mmol/L 25  Glucose 70 - 99 mg/dL 161 (H)  BUN 8 - 23 mg/dL 17  Creatinine 0.96 - 0.45 mg/dL 4.09 (H)  Calcium 8.9 - 10.3 mg/dL 9.0  Anion gap 5 - 15  16 (H)  Magnesium 1.7 - 2.4 mg/dL 2.3  GFR, Estimated >81 mL/min 39 (L)  WBC 4.0 - 10.5 K/uL 6.5  RBC 3.87 - 5.11 MIL/uL 4.66  Hemoglobin 12.0 - 15.0 g/dL 19.1  HCT 47.8 - 29.5 % 43.2  MCV 80.0 - 100.0 fL 92.7  MCH 26.0 - 34.0 pg 29.6  MCHC 30.0 - 36.0 g/dL 62.1  RDW 30.8 - 65.7 % 13.1  Platelets 150 - 400 K/uL 223  nRBC 0.0 - 0.2 % 0.0  (H): Data is abnormally high (L): Data is abnormally low  Family Communication: Updated plan of care with patient's son over phone this morning (david [phone] )  Disposition: Status: change OBS status to inpatient with telemetry  Planned Discharge Destination: Home    Time spent: 35  minutes  Author: Ernestene Mention, MD 03/06/2023 8:41 AM  For on call review [email or web address].

## 2023-03-06 NOTE — ED Notes (Signed)
ED TO INPATIENT HANDOFF REPORT  ED Nurse Name and Phone #: [id number] [phone]  S Name/Age/Gender Chelsey Young 82 y.o. female Room/Bed: 038C/038C  Code Status   Code Status: Full Code  Home/SNF/Other Home Patient oriented to: self, place, time, and situation Is this baseline? Yes   Triage Complete: Triage complete  Chief Complaint COPD exacerbation (HCC) [J44.1] Acute respiratory failure with hypoxia (HCC) [J96.01]  Triage Note Pt came in via POV from [facility] d/t her O2 levels were in the mid 80's to 90% on RA & son at bedside stated that pt has been not feeling well the last few weeks but has been having a chronic cough the past few yrs plus most recently noted she was very lethargic & c/o bil leg pain with a Hx of smoking.    Allergies No Known Allergies  Level of Care/Admitting Diagnosis ED Disposition     ED Disposition  Admit   Condition  --   Comment  Hospital Area: [facility] [[id number]]  Level of Care: Telemetry Medical [[id number]]  May admit patient to [facility] or [facility] if equivalent level of care is available:: Yes  Covid Evaluation: Confirmed COVID Negative  Diagnosis: Acute respiratory failure with hypoxia [facility]) [[id number]]  Admitting Physician: Ernestene Mention [[id number]]  Attending Physician: Ernestene Mention [[id number]]  Bed request comments: with telemetry  Certification:: I certify this patient will need inpatient services for at least 2 midnights  Expected Medical Readiness: 03/08/2023          B Medical/Surgery History Past Medical History:  Diagnosis Date   Arthritis    Cancer (HCC)    CERVICAL   COPD (chronic obstructive pulmonary disease) (HCC)    Hypercholesteremia    Hypertension    Hypertensive retinopathy    OU   Hypokalemia    Macular degeneration    Non-exu OU   Stroke [facility])    TIA   Thoracic aortic aneurysm without rupture (HCC) 10/29/2016   PER CT CHEST   Vitamin D deficiency    Past Surgical History:   Procedure Laterality Date   ABDOMINAL AORTIC ENDOVASCULAR STENT GRAFT N/A 02/29/2020   Procedure: ABDOMINAL AORTIC ENDOVASCULAR STENT GRAFT;  Surgeon: Cephus Shelling, MD;  Location: [facility] OR;  Service: Vascular;  Laterality: N/A;   ABDOMINAL HYSTERECTOMY     WITH BSO   CATARACT EXTRACTION Bilateral 2018   Dr. Vonna Kotyk   CHOLECYSTECTOMY     EYE SURGERY Bilateral 2018   Cat Sx - Dr. Vonna Kotyk     A IV Location/Drains/Wounds Patient Lines/Drains/Airways Status     Active Line/Drains/Airways     Name Placement date Placement time Site Days   Peripheral IV 03/06/23 20 G 1.75" Left Antecubital 03/06/23  0318  Antecubital  less than 1   Incision (Closed) 02/29/20 Groin Left 02/29/20  1146  -- 1101            Intake/Output Last 24 hours No intake or output data in the 24 hours ending 03/06/23 1732  Labs/Imaging Results for orders placed or performed during the hospital encounter of 03/05/23 (from the past 48 hour(s))  Basic metabolic panel     Status: Abnormal   Collection Time: 03/05/23  6:35 PM  Result Value Ref Range   Sodium 139 135 - 145 mmol/L   Potassium 3.5 3.5 - 5.1 mmol/L   Chloride 98 98 - 111 mmol/L   CO2 27 22 - 32 mmol/L   Glucose, Bld 192 (H) 70 -  99 mg/dL    Comment: Glucose reference range applies only to samples taken after fasting for at least 8 hours.   BUN 16 8 - 23 mg/dL   Creatinine, Ser 8.65 (H) 0.44 - 1.00 mg/dL   Calcium 9.1 8.9 - 78.4 mg/dL   GFR, Estimated 40 (L) >60 mL/min    Comment: (NOTE) Calculated using the CKD-EPI Creatinine Equation (2021)    Anion gap 14 5 - 15    Comment: Performed at [facility], [address], [place], [place] [address]  CBC     Status: None   Collection Time: 03/05/23  6:35 PM  Result Value Ref Range   WBC 8.5 4.0 - 10.5 K/uL   RBC 4.74 3.87 - 5.11 MIL/uL   Hemoglobin 13.9 12.0 - 15.0 g/dL   HCT 52.8 41.3 - 24.4 %   MCV 93.0 80.0 - 100.0 fL   MCH 29.3 26.0 - 34.0 pg   MCHC 31.5 30.0 - 36.0 g/dL   RDW  01.0 27.2 - 53.6 %   Platelets 228 150 - 400 K/uL   nRBC 0.0 0.0 - 0.2 %    Comment: Performed at [facility], [address] [place], [place] [address]  Troponin I (High Sensitivity)     Status: None   Collection Time: 03/05/23  6:35 PM  Result Value Ref Range   Troponin I (High Sensitivity) 8 <18 ng/L    Comment: (NOTE) Elevated high sensitivity troponin I (hsTnI) values and significant  changes across serial measurements may suggest ACS but many other  chronic and acute conditions are known to elevate hsTnI results.  Refer to the "Links" section for chest pain algorithms and additional  guidance. Performed at [facility], [address], [place], [place] [address]   Troponin I (High Sensitivity)     Status: None   Collection Time: 03/05/23  8:37 PM  Result Value Ref Range   Troponin I (High Sensitivity) 8 <18 ng/L    Comment: (NOTE) Elevated high sensitivity troponin I (hsTnI) values and significant  changes across serial measurements may suggest ACS but many other  chronic and acute conditions are known to elevate hsTnI results.  Refer to the "Links" section for chest pain algorithms and additional  guidance. Performed at [facility], [address], [place], [place] [address]   Brain natriuretic peptide     Status: None   Collection Time: 03/05/23  8:37 PM  Result Value Ref Range   B Natriuretic Peptide 83.1 0.0 - 100.0 pg/mL    Comment: Performed at [facility], [address], [place], [place] [address]  I-Stat venous blood gas, ED     Status: Abnormal   Collection Time: 03/05/23  9:52 PM  Result Value Ref Range   pH, Ven 7.396 7.25 - 7.43   pCO2, Ven 50.6 44 - 60 mmHg   pO2, Ven 50 (H) 32 - 45 mmHg   Bicarbonate 31.0 (H) 20.0 - 28.0 mmol/L   TCO2 33 (H) 22 - 32 mmol/L   O2 Saturation 84 %   Acid-Base Excess 5.0 (H) 0.0 - 2.0 mmol/L   Sodium 140 135 - 145 mmol/L   Potassium 3.3 (L) 3.5 - 5.1 mmol/L   Calcium, Ion 1.09 (L) 1.15 - 1.40  mmol/L   HCT 40.0 36.0 - 46.0 %   Hemoglobin 13.6 12.0 - 15.0 g/dL   Sample type VENOUS   D-dimer, quantitative     Status:  Abnormal   Collection Time: 03/05/23 11:21 PM  Result Value Ref Range   D-Dimer, Quant 0.83 (H) 0.00 - 0.50 ug/mL-FEU    Comment: (NOTE) At the manufacturer cut-off value of 0.5 g/mL FEU, this assay has a negative predictive value of 95-100%.This assay is intended for use in conjunction with a clinical pretest probability (PTP) assessment model to exclude pulmonary embolism (PE) and deep venous thrombosis (DVT) in outpatients suspected of PE or DVT. Results should be correlated with clinical presentation. Performed at [facility], [address], [place], [place] [address]   Respiratory (~20 pathogens) panel by PCR     Status: None   Collection Time: 03/06/23  1:04 AM   Specimen: Nasopharyngeal Swab; Respiratory  Result Value Ref Range   Adenovirus NOT DETECTED NOT DETECTED   Coronavirus 229E NOT DETECTED NOT DETECTED    Comment: (NOTE) The Coronavirus on the Respiratory Panel, DOES NOT test for the novel  Coronavirus (2019 nCoV)    Coronavirus HKU1 NOT DETECTED NOT DETECTED   Coronavirus NL63 NOT DETECTED NOT DETECTED   Coronavirus OC43 NOT DETECTED NOT DETECTED   Metapneumovirus NOT DETECTED NOT DETECTED   Rhinovirus / Enterovirus NOT DETECTED NOT DETECTED   Influenza A NOT DETECTED NOT DETECTED   Influenza B NOT DETECTED NOT DETECTED   Parainfluenza Virus 1 NOT DETECTED NOT DETECTED   Parainfluenza Virus 2 NOT DETECTED NOT DETECTED   Parainfluenza Virus 3 NOT DETECTED NOT DETECTED   Parainfluenza Virus 4 NOT DETECTED NOT DETECTED   Respiratory Syncytial Virus NOT DETECTED NOT DETECTED   Bordetella pertussis NOT DETECTED NOT DETECTED   Bordetella Parapertussis NOT DETECTED NOT DETECTED   Chlamydophila pneumoniae NOT DETECTED NOT DETECTED   Mycoplasma pneumoniae NOT DETECTED NOT DETECTED    Comment: Performed at [facility], [address] [place], [place] [address]  Resp panel by RT-PCR (RSV, Flu A&B, Covid) Nasopharyngeal Swab     Status: None   Collection Time: 03/06/23  1:04 AM   Specimen: Nasopharyngeal Swab; Nasal Swab  Result Value Ref Range   SARS Coronavirus 2 by RT PCR NEGATIVE NEGATIVE   Influenza A by PCR NEGATIVE NEGATIVE   Influenza B by PCR NEGATIVE NEGATIVE    Comment: (NOTE) The Xpert Xpress SARS-CoV-2/FLU/RSV plus assay is intended as an aid in the diagnosis of influenza from Nasopharyngeal swab specimens and should not be used as a sole basis for treatment. Nasal washings and aspirates are unacceptable for Xpert Xpress SARS-CoV-2/FLU/RSV testing.  Fact Sheet for Patients: BloggerCourse.com  Fact Sheet for Healthcare Providers: SeriousBroker.it  This test is not yet approved or cleared by the [place] FDA and has been authorized for detection and/or diagnosis of SARS-CoV-2 by FDA under an Emergency Use Authorization (EUA). This EUA will remain in effect (meaning this test can be used) for the duration of the COVID-19 declaration under Section 564(b)(1) of the Act, 21 U.S.C. section 360bbb-3(b)(1), unless the authorization is terminated or revoked.     Resp Syncytial Virus by PCR NEGATIVE NEGATIVE    Comment: (NOTE) Fact Sheet for Patients: BloggerCourse.com  Fact Sheet for Healthcare Providers: SeriousBroker.it  This test is not yet approved or cleared by the [place] FDA and has been authorized for detection and/or diagnosis of SARS-CoV-2 by FDA under an Emergency Use Authorization (EUA). This EUA will remain in effect (meaning this test can be used) for the duration of the COVID-19 declaration under Section 564(b)(1) of the Act, 21 U.S.C. section 360bbb-3(b)(1), unless  the authorization is terminated or revoked.  Performed at [facility], [address]  [place], [place] [address]   CBC     Status: None   Collection Time: 03/06/23  3:25 AM  Result Value Ref Range   WBC 6.5 4.0 - 10.5 K/uL   RBC 4.66 3.87 - 5.11 MIL/uL   Hemoglobin 13.8 12.0 - 15.0 g/dL   HCT 73.7 10.6 - 26.9 %   MCV 92.7 80.0 - 100.0 fL   MCH 29.6 26.0 - 34.0 pg   MCHC 31.9 30.0 - 36.0 g/dL   RDW 48.5 46.2 - 70.3 %   Platelets 223 150 - 400 K/uL   nRBC 0.0 0.0 - 0.2 %    Comment: Performed at [facility], [address], [place], [place] [address]  Basic metabolic panel     Status: Abnormal   Collection Time: 03/06/23  3:25 AM  Result Value Ref Range   Sodium 140 135 - 145 mmol/L   Potassium 3.5 3.5 - 5.1 mmol/L   Chloride 99 98 - 111 mmol/L   CO2 25 22 - 32 mmol/L   Glucose, Bld 190 (H) 70 - 99 mg/dL    Comment: Glucose reference range applies only to samples taken after fasting for at least 8 hours.   BUN 17 8 - 23 mg/dL   Creatinine, Ser 8.18 (H) 0.44 - 1.00 mg/dL   Calcium 9.0 8.9 - 29.9 mg/dL   GFR, Estimated 39 (L) >60 mL/min    Comment: (NOTE) Calculated using the CKD-EPI Creatinine Equation (2021)    Anion gap 16 (H) 5 - 15    Comment: Performed at [facility], [address] [place], [place] [address]  Magnesium     Status: None   Collection Time: 03/06/23  3:25 AM  Result Value Ref Range   Magnesium 2.3 1.7 - 2.4 mg/dL    Comment: Performed at [facility], [address], [place], [place] [address]  Urinalysis, Routine w reflex microscopic -Urine, Clean Catch     Status: None   Collection Time: 03/06/23  4:27 PM  Result Value Ref Range   Color, Urine YELLOW YELLOW   APPearance CLEAR CLEAR   Specific Gravity, Urine 1.010 1.005 - 1.030   pH 6.0 5.0 - 8.0   Glucose, UA NEGATIVE NEGATIVE mg/dL   Hgb urine dipstick NEGATIVE NEGATIVE   Bilirubin Urine NEGATIVE NEGATIVE   Ketones, ur NEGATIVE NEGATIVE mg/dL   Protein, ur NEGATIVE NEGATIVE mg/dL   Nitrite NEGATIVE NEGATIVE   Leukocytes,Ua NEGATIVE NEGATIVE    Comment:  Microscopic not done on urines with negative protein, blood, leukocytes, nitrite, or glucose < 500 mg/dL. Performed at [facility], [address], [place], [place] [address]    VAS [place] LOWER EXTREMITY VENOUS (DVT)  Result Date: 03/06/2023  Lower Venous DVT Study Patient Name:  Chelsey Young  Date of Exam:   03/06/2023 Medical Rec #: [id number]            Accession #:    [id number] Date of Birth: 1941/01/14            Patient Gender: F Patient Age:   59 years Exam Location:  [facility] Procedure:      VAS [place] LOWER EXTREMITY VENOUS (DVT) Referring Phys: ALEXIS HUGELMEYER --------------------------------------------------------------------------------  Indications: Positive D-dimer (0.83).  Comparison Study: No previous exams Performing Technologist: Jody Hill RVT, RDMS  Examination Guidelines: A complete evaluation includes B-mode  imaging, spectral Doppler, color Doppler, and power Doppler as needed of all accessible portions of each vessel. Bilateral testing is considered an integral part of a complete examination. Limited examinations for reoccurring indications may be performed as noted. The reflux portion of the exam is performed with the patient in reverse Trendelenburg.  +---------+---------------+---------+-----------+----------+--------------+ RIGHT    CompressibilityPhasicitySpontaneityPropertiesThrombus Aging +---------+---------------+---------+-----------+----------+--------------+ CFV      Full           Yes      Yes                                 +---------+---------------+---------+-----------+----------+--------------+ SFJ      Full                                                        +---------+---------------+---------+-----------+----------+--------------+ FV Prox  Full           Yes      Yes                                 +---------+---------------+---------+-----------+----------+--------------+ FV Mid   Full           Yes      Yes                                  +---------+---------------+---------+-----------+----------+--------------+ FV DistalFull           Yes      Yes                                 +---------+---------------+---------+-----------+----------+--------------+ PFV      Full                                                        +---------+---------------+---------+-----------+----------+--------------+ POP      Full           Yes      Yes                                 +---------+---------------+---------+-----------+----------+--------------+ PTV      Full                                                        +---------+---------------+---------+-----------+----------+--------------+ PERO     Full                                                        +---------+---------------+---------+-----------+----------+--------------+   +---------+---------------+---------+-----------+----------+--------------+ LEFT     CompressibilityPhasicitySpontaneityPropertiesThrombus Aging +---------+---------------+---------+-----------+----------+--------------+ CFV  Full           Yes      Yes                                 +---------+---------------+---------+-----------+----------+--------------+ SFJ      Full                                                        +---------+---------------+---------+-----------+----------+--------------+ FV Prox  Full           Yes      Yes                                 +---------+---------------+---------+-----------+----------+--------------+ FV Mid   Full           Yes      Yes                                 +---------+---------------+---------+-----------+----------+--------------+ FV DistalFull           Yes      Yes                                 +---------+---------------+---------+-----------+----------+--------------+ PFV      Full                                                         +---------+---------------+---------+-----------+----------+--------------+ POP      Full           Yes      Yes                                 +---------+---------------+---------+-----------+----------+--------------+ PTV      Full                                                        +---------+---------------+---------+-----------+----------+--------------+ PERO     Full                                                        +---------+---------------+---------+-----------+----------+--------------+     Summary: BILATERAL: - No evidence of deep vein thrombosis seen in the lower extremities, bilaterally. -No evidence of popliteal cyst, bilaterally.   *See table(s) above for measurements and observations. Electronically signed by Lemar Livings MD on 03/06/2023 at 3:04:14 PM.    Final    CT Head Wo Contrast  Result Date: 03/05/2023 CLINICAL DATA:  Altered mental status. EXAM: CT HEAD WITHOUT CONTRAST TECHNIQUE: Contiguous axial images were obtained from the  base of the skull through the vertex without intravenous contrast. RADIATION DOSE REDUCTION: This exam was performed according to the departmental dose-optimization program which includes automated exposure control, adjustment of the mA and/or kV according to patient size and/or use of iterative reconstruction technique. COMPARISON:  None Available. FINDINGS: Brain: There is mild cerebral atrophy with widening of the extra-axial spaces and ventricular dilatation. There are areas of decreased attenuation within the white matter tracts of the supratentorial brain, consistent with microvascular disease changes. A chronic right parietooccipital infarct is seen. Vascular: There is moderate to marked severity calcification of the bilateral cavernous carotid arteries. Skull: Normal. Negative for fracture or focal lesion. Sinuses/Orbits: No acute finding. Other: None. IMPRESSION: 1. Generalized cerebral atrophy with chronic white matter small  vessel ischemic changes. 2. Chronic right parietooccipital infarct. 3. No acute intracranial abnormality. Electronically Signed   By: Aram Candela M.D.   On: 03/05/2023 23:39   DG Chest 2 View  Result Date: 03/05/2023 CLINICAL DATA:  Dyspnea EXAM: CHEST - 2 VIEW COMPARISON:  05/09/2013 chest radiograph. FINDINGS: Stable cardiomediastinal silhouette with normal heart size. No pneumothorax. No pleural effusion. Mildly hyperinflated lungs. No pulmonary edema. No acute consolidative airspace disease. Partially visualized abdominal aortic stent graft. Chronic severe L1 vertebral compression fracture. IMPRESSION: Mildly hyperinflated lungs, cannot exclude COPD. Otherwise no active cardiopulmonary disease. Electronically Signed   By: Delbert Phenix M.D.   On: 03/05/2023 19:31    Pending Labs Unresulted Labs (From admission, onward)     Start     Ordered   03/06/23 0022  Expectorated Sputum Assessment w Gram Stain, Rflx to Resp Cult  (COPD / Pneumonia / Cellulitis / Lower Extremity Wound)  Once,   R        03/06/23 0021   03/05/23 2307  Urinalysis, Routine w reflex microscopic -Urine, Clean Catch  Once,   URGENT       Question:  Specimen Source  Answer:  Urine, Clean Catch   03/05/23 2307            Vitals/Pain Today's Vitals   03/06/23 1215 03/06/23 1225 03/06/23 1300 03/06/23 1354  BP: 127/66  (!) 142/78   Pulse: 79 69 94   Resp:  18 17   Temp:    (!) 97.5 F (36.4 C)  TempSrc:    Oral  SpO2: (!) 83% (!) 87% (!) 87%   Weight:      Height:      PainSc:        Isolation Precautions Droplet precaution  Medications Medications  aspirin EC tablet 81 mg (81 mg Oral Not Given 03/06/23 0943)  fluticasone furoate-vilanterol (BREO ELLIPTA) 100-25 MCG/ACT 1 puff (1 puff Inhalation Not Given 03/06/23 0942)  simvastatin (ZOCOR) tablet 40 mg (40 mg Oral Given 03/06/23 1716)  triamterene-hydrochlorothiazide (MAXZIDE-25) 37.5-25 MG per tablet 1 tablet (1 tablet Oral Patient Refused/Not Given  03/06/23 1156)  calcitonin (salmon) (MIACALCIN/FORTICAL) nasal spray 1 spray (1 spray Alternating Nares Not Given 03/06/23 1157)  pantoprazole (PROTONIX) EC tablet 40 mg (40 mg Oral Not Given 03/06/23 0942)  calcium carbonate (OS-CAL - dosed in mg of elemental calcium) tablet 1,250 mg (1,250 mg Oral Given 03/06/23 1716)  cholecalciferol (VITAMIN D3) 25 MCG (1000 UNIT) tablet 5,000 Units (5,000 Units Oral Given 03/06/23 1200)  potassium chloride (KLOR-CON M) CR tablet 10 mEq (10 mEq Oral Given 03/06/23 0941)  sodium chloride flush (NS) 0.9 % injection 3 mL (3 mLs Intravenous Given 03/06/23 0942)  acetaminophen (TYLENOL) tablet 650 mg (has  no administration in time range)    Or  acetaminophen (TYLENOL) suppository 650 mg (has no administration in time range)  HYDROcodone-acetaminophen (NORCO/VICODIN) 5-325 MG per tablet 1-2 tablet (has no administration in time range)  morphine (PF) 2 MG/ML injection 1 mg (has no administration in time range)  traZODone (DESYREL) tablet 25 mg (has no administration in time range)  senna-docusate (Senokot-S) tablet 1 tablet (has no administration in time range)  bisacodyl (DULCOLAX) EC tablet 5 mg (has no administration in time range)  ondansetron (ZOFRAN) tablet 4 mg (has no administration in time range)    Or  ondansetron (ZOFRAN) injection 4 mg (has no administration in time range)  hydrALAZINE (APRESOLINE) injection 5 mg (has no administration in time range)  azithromycin (ZITHROMAX) 500 mg in sodium chloride 0.9 % 250 mL IVPB (0 mg Intravenous Stopped 03/06/23 0109)    Followed by  azithromycin (ZITHROMAX) tablet 500 mg (has no administration in time range)  ipratropium-albuterol (DUONEB) 0.5-2.5 (3) MG/3ML nebulizer solution 3 mL (has no administration in time range)  guaiFENesin (MUCINEX) 12 hr tablet 600 mg (600 mg Oral Given 03/06/23 0941)  methylPREDNISolone sodium succinate (SOLU-MEDROL) 125 mg/2 mL injection 60 mg (60 mg Intravenous Given 03/06/23 1717)   polyethylene glycol (MIRALAX / GLYCOLAX) packet 17 g (17 g Oral Not Given 03/06/23 1035)  mupirocin cream (BACTROBAN) 2 % ( Topical Given 03/06/23 1630)  methylPREDNISolone sodium succinate (SOLU-MEDROL) 125 mg/2 mL injection 125 mg (125 mg Intravenous Given 03/05/23 2141)  ipratropium-albuterol (DUONEB) 0.5-2.5 (3) MG/3ML nebulizer solution 3 mL (3 mLs Nebulization Given 03/05/23 2140)  enoxaparin (LOVENOX) injection 52.5 mg (52.5 mg Subcutaneous Given 03/06/23 0109)    Mobility walks with person assist     Focused Assessments Pulmonary Assessment Handoff:  Lung sounds: Bilateral Breath Sounds: Rhonchi, Rales L Breath Sounds: Expiratory wheezes O2 Device: Nasal Cannula O2 Flow Rate (L/min): 4 L/min    R Recommendations: See Admitting Provider Note  Report given to:   Additional Notes:

## 2023-03-06 NOTE — ED Notes (Signed)
Pt was found naked in room with blood all over body, bed and floor/ pt got OOB and took clothes off and urinated in floor, and pulled IV out/ bleeding is controlled/ pt cleaned up and bed linen changed/ floors mopped/ call bell was on bed rail and pt was reminded to use call bell/ pt is currently only oriented to self/ prior to incident, pt was A&Ox3/ pt steady while walking, pt walked back to bed/ bed alarm placed on bed

## 2023-03-06 NOTE — ED Notes (Signed)
Patient resting in bed with eyes closed, no s/s of distress will continue to monitor.

## 2023-03-07 ENCOUNTER — Inpatient Hospital Stay (HOSPITAL_COMMUNITY): Payer: Medicare PPO

## 2023-03-07 DIAGNOSIS — J441 Chronic obstructive pulmonary disease with (acute) exacerbation: Secondary | ICD-10-CM | POA: Diagnosis not present

## 2023-03-07 LAB — PROCALCITONIN: Procalcitonin: <0.1 ng/mL

## 2023-03-07 LAB — EXPECTORATED SPUTUM ASSESSMENT W GRAM STAIN, RFLX TO RESP C

## 2023-03-07 LAB — BRAIN NATRIURETIC PEPTIDE: B Natriuretic Peptide: 182.5 pg/mL — ABNORMAL HIGH (ref 0.0–100.0)

## 2023-03-07 LAB — C-REACTIVE PROTEIN: CRP: <0.5 mg/dL (ref <1.0)

## 2023-03-07 MED ORDER — POTASSIUM CHLORIDE CRYS ER 20 MEQ PO TBCR
40.0000 meq | EXTENDED_RELEASE_TABLET | Freq: Once | ORAL | Status: AC
  Administered 2023-03-07: 40 meq via ORAL
  Filled 2023-03-07: qty 2

## 2023-03-07 MED ORDER — METHYLPREDNISOLONE SODIUM SUCC 125 MG IJ SOLR
60.0000 mg | Freq: Two times a day (BID) | INTRAMUSCULAR | Status: DC
  Administered 2023-03-07 – 2023-03-08 (×2): 60 mg via INTRAVENOUS
  Filled 2023-03-07 (×2): qty 2

## 2023-03-07 MED ORDER — ENOXAPARIN SODIUM 30 MG/0.3ML IJ SOSY
30.0000 mg | PREFILLED_SYRINGE | INTRAMUSCULAR | Status: DC
  Administered 2023-03-07 – 2023-03-08 (×2): 30 mg via SUBCUTANEOUS
  Filled 2023-03-07 (×2): qty 0.3

## 2023-03-07 MED ORDER — FUROSEMIDE 10 MG/ML IJ SOLN
20.0000 mg | Freq: Once | INTRAMUSCULAR | Status: AC
  Administered 2023-03-07: 20 mg via INTRAVENOUS
  Filled 2023-03-07: qty 2

## 2023-03-07 MED ORDER — POTASSIUM CHLORIDE CRYS ER 10 MEQ PO TBCR
10.0000 meq | EXTENDED_RELEASE_TABLET | Freq: Every day | ORAL | Status: DC
  Administered 2023-03-08: 10 meq via ORAL
  Filled 2023-03-07: qty 1

## 2023-03-07 MED ORDER — HYDROCODONE-ACETAMINOPHEN 5-325 MG PO TABS: 1.0000 | ORAL_TABLET | Freq: Four times a day (QID) | ORAL | Status: DC | PRN

## 2023-03-07 NOTE — Plan of Care (Signed)
  Problem: Education: Goal: Knowledge of disease or condition will improve Outcome: Progressing Goal: Knowledge of the prescribed therapeutic regimen will improve Outcome: Progressing   Problem: Activity: Goal: Ability to tolerate increased activity will improve Outcome: Progressing Goal: Will verbalize the importance of balancing activity with adequate rest periods Outcome: Progressing   Problem: Respiratory: Goal: Ability to maintain a clear airway will improve Outcome: Progressing Goal: Levels of oxygenation will improve Outcome: Progressing Goal: Ability to maintain adequate ventilation will improve Outcome: Progressing   Problem: Education: Goal: Knowledge of General Education information will improve Description: Including pain rating scale, medication(s)/side effects and non-pharmacologic comfort measures Outcome: Progressing   Problem: Health Behavior/Discharge Planning: Goal: Ability to manage health-related needs will improve Outcome: Progressing   Problem: Clinical Measurements: Goal: Ability to maintain clinical measurements within normal limits will improve Outcome: Progressing Goal: Respiratory complications will improve Outcome: Progressing

## 2023-03-07 NOTE — Evaluation (Signed)
Physical Therapy Evaluation Patient Details Name: Chelsey Young MRN: [id number] DOB: 1940/12/29 Today's Date: 03/07/2023  History of Present Illness  82 y.o. female being admitted 03/06/23 with Acute hypoxic respiratory failure 2/2 exacerbation of COPD, AMS; CT head no acute changes;   PMH osteoarthritis, COPD, hypertension, hyperlipidemia, CVA  Clinical Impression   Pt admitted secondary to problem above with deficits below. PTA patient was living with son (who works second shift) in home with 5 steps to enter. She was not using home oxygen.  Pt currently requires CGA and 6L O2 to ambulate with sats still dropping to 84% and requiring 60 second rest to recover to 88%. Began to discuss likely need for home oxygen and how to manage tubing as it will be a tripping hazard. Pt reports she "won't have that mess in my house." States he husband had it and she's not going to deal with it unless she gets a small portable tank that she can carry with her. At this point daughter arrived and explained to them both that I do not think insurance covers that type of oxygen set-up. Will need to practice managing O2 tubing with multiple extensions prior to discharge home. Anticipate patient will benefit from PT to address problems listed below.Will continue to follow acutely to maximize functional mobility independence and safety.           If plan is discharge home, recommend the following: Assistance with cooking/housework   Can travel by private vehicle        Equipment Recommendations None recommended by PT (likely will need home O2)  Recommendations for Other Services       Functional Status Assessment Patient has had a recent decline in their functional status and demonstrates the ability to make significant improvements in function in a reasonable and predictable amount of time.     Precautions / Restrictions Precautions Precautions: Fall Precaution Comments: has longstanding limp/imbalance  from toe amputation per pt Restrictions Weight Bearing Restrictions: No      Mobility  Bed Mobility Overal bed mobility: Independent                  Transfers Overall transfer level: Independent Equipment used: None                    Ambulation/Gait Ambulation/Gait assistance: Contact guard assist Gait Distance (Feet): 200 Feet Assistive device: None Gait Pattern/deviations: Step-through pattern, Antalgic, Drifts right/left   Gait velocity interpretation: 1.31 - 2.62 ft/sec, indicative of limited community ambulator   General Gait Details: pt reports she normally has a limp and drifts rt or lt due to toe amputation; states she does better with her shoes on; CGA for portable oxygen and lines  Stairs            Wheelchair Mobility     Tilt Bed    Modified Rankin (Stroke Patients Only)       Balance Overall balance assessment: Mild deficits observed, not formally tested (during gait (see above) pt able to independently recover)                                           Pertinent Vitals/Pain Pain Assessment Pain Assessment: No/denies pain    Home Living Family/patient expects to be discharged to:: Private residence Living Arrangements: Children (son (works second shift)) Available Help at Discharge: Family;Available 24 hours/day (son-in-law  available to stay with her during the day (per daughter)) Type of Home: House Home Access: Stairs to enter Entrance Stairs-Rails: Right Entrance Stairs-Number of Steps: 5   Home Layout: One level Home Equipment: None      Prior Function Prior Level of Function : Independent/Modified Independent             Mobility Comments: uses no device; reports walks better with her shoes       Extremity/Trunk Assessment   Upper Extremity Assessment Upper Extremity Assessment: Overall WFL for tasks assessed    Lower Extremity Assessment Lower Extremity Assessment: Overall WFL for  tasks assessed    Cervical / Trunk Assessment Cervical / Trunk Assessment: Normal  Communication   Communication Communication: No apparent difficulties  Cognition Arousal: Alert Behavior During Therapy: WFL for tasks assessed/performed Overall Cognitive Status:  (a&ox4; following all commands; decr awareness of need for home O2 stating "I won't have all that mess at my house")                                          General Comments General comments (skin integrity, edema, etc.): see ambulatory oxygen saturation note. Began to educate on how to manage home oxygen tubing with mobility and pt insistent she will not use oxygen at home. Explained she likely will need it when MD discharges her and she continued to state she will not use it.    Exercises     Assessment/Plan    PT Assessment Patient needs continued PT services  PT Problem List Decreased activity tolerance;Decreased balance;Decreased mobility;Decreased safety awareness;Cardiopulmonary status limiting activity       PT Treatment Interventions Gait training;Functional mobility training;Therapeutic activities;Therapeutic exercise;Balance training;Patient/family education    PT Goals (Current goals can be found in the Care Plan section)  Acute Rehab PT Goals Patient Stated Goal: go home without oxygen PT Goal Formulation: With patient Time For Goal Achievement: 03/21/23 Potential to Achieve Goals: Good    Frequency Min 1X/week     Co-evaluation               AM-PAC PT "6 Clicks" Mobility  Outcome Measure Help needed turning from your back to your side while in a flat bed without using bedrails?: None Help needed moving from lying on your back to sitting on the side of a flat bed without using bedrails?: None Help needed moving to and from a bed to a chair (including a wheelchair)?: None Help needed standing up from a chair using your arms (e.g., wheelchair or bedside chair)?: None Help  needed to walk in hospital room?: None Help needed climbing 3-5 steps with a railing? : A Little 6 Click Score: 23    End of Session Equipment Utilized During Treatment: Gait belt Activity Tolerance: Treatment limited secondary to medical complications (Comment) (required standing rest break due to desaturation to 84% on 6L) Patient left: in chair;with call bell/phone within reach;with chair alarm set Nurse Communication: Mobility status PT Visit Diagnosis: Difficulty in walking, not elsewhere classified (R26.2)    Time: 1610-9604 PT Time Calculation (min) (ACUTE ONLY): 25 min   Charges:   PT Evaluation $PT Eval Low Complexity: 1 Low PT Treatments $Self Care/Home Management: 8-22 PT General Charges $$ ACUTE PT VISIT: 1 Visit          Jerolyn Center, PT Acute Rehabilitation Services  Office [phone]   Larita Fife  P Bren Steers 03/07/2023, 9:53 AM

## 2023-03-07 NOTE — Progress Notes (Signed)
PROGRESS NOTE                                                                                                                                                                                                             Patient Demographics:    Chelsey Young, is a 82 y.o. female, DOB - 1940/09/08, UEA:[id number]  Outpatient Primary MD for the patient is Merri Brunette, MD    LOS - 1  Admit date - 03/05/2023    Chief Complaint  Patient presents with   Shortness of Breath   Leg Pain   Cough       Brief Narrative (HPI from H&P)   82 y.o. female with a history of osteoarthritis, COPD, hypertension, hyperlipidemia, CVA  now being admitted with Acute hypoxic respiratory failure 2/2 exacerbation of COPD, had a new oxygen requirement and was admitted to the hospital.   Subjective:    Chelsey Young today has, No headache, No chest pain, No abdominal pain - No Nausea, No new weakness tingling or numbness, mild cough and shortness of breath.   Assessment  & Plan :    Acute hypoxic respiratory failure due to COPD exacerbation.  She is being treated with IV steroids, oral azithromycin along with supportive care with nebulizer treatments, clinically improving, encouraged to advance activity, use I-S and flutter valve sitting in chair, will try to titrate off oxygen.  If continues to improve discharge in the next 1 to 2 days.  May require home oxygen.  Acute metabolic encephalopathy.  Due to #1 above.  Resolved with supportive care.  CT head nonacute.  Back to her baseline.   Minimally elevated D-dimer.  Likely due to inflammation from #1 above.  Lower extremity venous duplex negative.  No further workup.  Prophylactic Lovenox started.  AKI versus CKD 3 A.  Readmittance stable at 1.3 with hydration.  Monitor.  PCP to monitor outpatient avoid nephrotoxins.  Hypokalemia.  Replaced.  Hypertension.  Currently stable off of  medications.  Dyslipidemia.  On statin.  GERD.  Continue PPI.       Condition - Fair  Family Communication  :  None  Code Status :  Full  Consults  :  None  PUD Prophylaxis : PPI   Procedures  :     Bilateral lower extremity venous duplex.  No DVT.  CT head.  Nonacute, old right parieto-occipital infarct noted.      Disposition Plan  :    Status is: Inpatient  DVT Prophylaxis  :    enoxaparin (LOVENOX) injection 30 mg Start: 03/07/23 1000 SCDs Start: 03/06/23 0022    Lab Results  Component Value Date   PLT 223 03/06/2023    Diet :  Diet Order             Diet Heart Room service appropriate? Yes; Fluid consistency: Thin  Diet effective now                    Inpatient Medications  Scheduled Meds:  aspirin EC  81 mg Oral Daily   azithromycin  500 mg Oral Daily   calcitonin (salmon)  1 spray Alternating Nares Daily   calcium carbonate  1,250 mg Oral TID WC   cholecalciferol  5,000 Units Oral Daily   enoxaparin (LOVENOX) injection  30 mg Subcutaneous Q24H   fluticasone furoate-vilanterol  1 puff Inhalation Daily   furosemide  20 mg Intravenous Once   guaiFENesin  600 mg Oral BID   methylPREDNISolone (SOLU-MEDROL) injection  60 mg Intravenous Q12H   mupirocin cream   Topical BID   pantoprazole  40 mg Oral Daily   polyethylene glycol  17 g Oral Daily   [START ON 03/08/2023] potassium chloride  10 mEq Oral Daily   simvastatin  40 mg Oral QPM   sodium chloride flush  3 mL Intravenous Q12H   Continuous Infusions: PRN Meds:.acetaminophen **OR** acetaminophen, bisacodyl, haloperidol lactate, hydrALAZINE, HYDROcodone-acetaminophen, ipratropium-albuterol, ondansetron **OR** ondansetron (ZOFRAN) IV, senna-docusate, traZODone     Objective:   Vitals:   03/06/23 1845 03/06/23 2100 03/06/23 2318 03/07/23 0831  BP: 120/66 (!) 140/63 (!) 111/55   Pulse: (!) 103 73 89   Resp: 17 20    Temp:  98.3 F (36.8 C) 98 F (36.7 C)   TempSrc:  Oral Oral    SpO2: 97% 91% 91% 90%  Weight:      Height:        Wt Readings from Last 3 Encounters:  03/05/23 52.2 kg  04/14/22 52.2 kg  02/25/22 54 kg    No intake or output data in the 24 hours ending 03/07/23 7253   Physical Exam  Awake Alert, No new F.N deficits, Normal affect [place].AT,PERRAL Supple Neck, No JVD,   Symmetrical Chest wall movement, Moderate air movement bilaterally, mild expiratory wheezing and some crackles RRR,No Gallops,Rubs or new Murmurs,  +ve B.Sounds, Abd Soft, No tenderness,   No Cyanosis, Clubbing or edema      Data Review:    Recent Labs  Lab 03/05/23 1835 03/05/23 2152 03/06/23 0325  WBC 8.5  --  6.5  HGB 13.9 13.6 13.8  HCT 44.1 40.0 43.2  PLT 228  --  223  MCV 93.0  --  92.7  MCH 29.3  --  29.6  MCHC 31.5  --  31.9  RDW 12.9  --  13.1    Recent Labs  Lab 03/05/23 1835 03/05/23 2037 03/05/23 2152 03/05/23 2321 03/06/23 0325 03/07/23 0547  NA 139  --  140  --  140  --   K 3.5  --  3.3*  --  3.5  --   CL 98  --   --   --  99  --   CO2 27  --   --   --  25  --   ANIONGAP 14  --   --   --  16*  --   GLUCOSE 192*  --   --   --  190*  --   BUN 16  --   --   --  17  --   CREATININE 1.31*  --   --   --  1.34*  --   CRP  --   --   --   --   --  <0.5  DDIMER  --   --   --  0.83*  --   --   PROCALCITON  --   --   --   --   --  <0.10  BNP  --  83.1  --   --   --  182.5*  MG  --   --   --   --  2.3  --   CALCIUM 9.1  --   --   --  9.0  --       Recent Labs  Lab 03/05/23 1835 03/05/23 2037 03/05/23 2321 03/06/23 0325 03/07/23 0547  CRP  --   --   --   --  <0.5  DDIMER  --   --  0.83*  --   --   PROCALCITON  --   --   --   --  <0.10  BNP  --  83.1  --   --  182.5*  MG  --   --   --  2.3  --   CALCIUM 9.1  --   --  9.0  --     DG Chest Port 1 View  Result Date: 03/07/2023 CLINICAL DATA:  Shortness of breath EXAM: PORTABLE CHEST 1 VIEW COMPARISON:  Two days ago FINDINGS: Normal heart size and mediastinal contours. No acute  infiltrate or edema. No effusion or pneumothorax. No acute osseous findings. Artifact from EKG leads. Minimally covered abdominal aortic stent graft. IMPRESSION: No active disease. Electronically Signed   By: Tiburcio Pea M.D.   On: 03/07/2023 07:34   VAS [place] LOWER EXTREMITY VENOUS (DVT)  Result Date: 03/06/2023  Lower Venous DVT Study Patient Name:  Chelsey Young  Date of Exam:   03/06/2023 Medical Rec #: [id number]            Accession #:    [id number] Date of Birth: [birth year]            Patient Gender: F Patient Age:   1 years Exam Location:  [facility] Procedure:      VAS [place] LOWER EXTREMITY VENOUS (DVT) Referring Phys: ALEXIS HUGELMEYER --------------------------------------------------------------------------------  Indications: Positive D-dimer (0.83).  Comparison Study: No previous exams Performing Technologist: Jody Hill RVT, RDMS  Examination Guidelines: A complete evaluation includes B-mode imaging, spectral Doppler, color Doppler, and power Doppler as needed of all accessible portions of each vessel. Bilateral testing is considered an integral part of a complete examination. Limited examinations for reoccurring indications may be performed as noted. The reflux portion of the exam is performed with the patient in reverse Trendelenburg.  +---------+---------------+---------+-----------+----------+--------------+ RIGHT    CompressibilityPhasicitySpontaneityPropertiesThrombus Aging +---------+---------------+---------+-----------+----------+--------------+ CFV      Full           Yes      Yes                                 +---------+---------------+---------+-----------+----------+--------------+ SFJ      Full                                                        +---------+---------------+---------+-----------+----------+--------------+  FV Prox  Full           Yes      Yes                                  +---------+---------------+---------+-----------+----------+--------------+ FV Mid   Full           Yes      Yes                                 +---------+---------------+---------+-----------+----------+--------------+ FV DistalFull           Yes      Yes                                 +---------+---------------+---------+-----------+----------+--------------+ PFV      Full                                                        +---------+---------------+---------+-----------+----------+--------------+ POP      Full           Yes      Yes                                 +---------+---------------+---------+-----------+----------+--------------+ PTV      Full                                                        +---------+---------------+---------+-----------+----------+--------------+ PERO     Full                                                        +---------+---------------+---------+-----------+----------+--------------+   +---------+---------------+---------+-----------+----------+--------------+ LEFT     CompressibilityPhasicitySpontaneityPropertiesThrombus Aging +---------+---------------+---------+-----------+----------+--------------+ CFV      Full           Yes      Yes                                 +---------+---------------+---------+-----------+----------+--------------+ SFJ      Full                                                        +---------+---------------+---------+-----------+----------+--------------+ FV Prox  Full           Yes      Yes                                 +---------+---------------+---------+-----------+----------+--------------+ FV Mid   Full  Yes      Yes                                 +---------+---------------+---------+-----------+----------+--------------+ FV DistalFull           Yes      Yes                                  +---------+---------------+---------+-----------+----------+--------------+ PFV      Full                                                        +---------+---------------+---------+-----------+----------+--------------+ POP      Full           Yes      Yes                                 +---------+---------------+---------+-----------+----------+--------------+ PTV      Full                                                        +---------+---------------+---------+-----------+----------+--------------+ PERO     Full                                                        +---------+---------------+---------+-----------+----------+--------------+     Summary: BILATERAL: - No evidence of deep vein thrombosis seen in the lower extremities, bilaterally. -No evidence of popliteal cyst, bilaterally.   *See table(s) above for measurements and observations. Electronically signed by Lemar Livings MD on 03/06/2023 at 3:04:14 PM.    Final    CT Head Wo Contrast  Result Date: 03/05/2023 CLINICAL DATA:  Altered mental status. EXAM: CT HEAD WITHOUT CONTRAST TECHNIQUE: Contiguous axial images were obtained from the base of the skull through the vertex without intravenous contrast. RADIATION DOSE REDUCTION: This exam was performed according to the departmental dose-optimization program which includes automated exposure control, adjustment of the mA and/or kV according to patient size and/or use of iterative reconstruction technique. COMPARISON:  None Available. FINDINGS: Brain: There is mild cerebral atrophy with widening of the extra-axial spaces and ventricular dilatation. There are areas of decreased attenuation within the white matter tracts of the supratentorial brain, consistent with microvascular disease changes. A chronic right parietooccipital infarct is seen. Vascular: There is moderate to marked severity calcification of the bilateral cavernous carotid arteries. Skull: Normal. Negative for  fracture or focal lesion. Sinuses/Orbits: No acute finding. Other: None. IMPRESSION: 1. Generalized cerebral atrophy with chronic white matter small vessel ischemic changes. 2. Chronic right parietooccipital infarct. 3. No acute intracranial abnormality. Electronically Signed   By: Aram Candela M.D.   On: 03/05/2023 23:39   DG Chest 2 View  Result Date: 03/05/2023 CLINICAL DATA:  Dyspnea EXAM: CHEST - 2 VIEW COMPARISON:  05/09/2013 chest radiograph. FINDINGS: Stable  cardiomediastinal silhouette with normal heart size. No pneumothorax. No pleural effusion. Mildly hyperinflated lungs. No pulmonary edema. No acute consolidative airspace disease. Partially visualized abdominal aortic stent graft. Chronic severe L1 vertebral compression fracture. IMPRESSION: Mildly hyperinflated lungs, cannot exclude COPD. Otherwise no active cardiopulmonary disease. Electronically Signed   By: Delbert Phenix M.D.   On: 03/05/2023 19:31      Signature  -   Susa Raring M.D on 03/07/2023 at 9:21 AM   -  To page go to [email or web address]

## 2023-03-07 NOTE — Plan of Care (Signed)
  Problem: Education: Goal: Knowledge of disease or condition will improve Outcome: Progressing Goal: Knowledge of the prescribed therapeutic regimen will improve Outcome: Progressing Goal: Individualized Educational Video(s) Outcome: Progressing   Problem: Activity: Goal: Ability to tolerate increased activity will improve Outcome: Progressing Goal: Will verbalize the importance of balancing activity with adequate rest periods Outcome: Progressing   Problem: Respiratory: Goal: Ability to maintain a clear airway will improve Outcome: Progressing Goal: Levels of oxygenation will improve Outcome: Progressing Goal: Ability to maintain adequate ventilation will improve Outcome: Progressing   Problem: Clinical Measurements: Goal: Ability to maintain clinical measurements within normal limits will improve Outcome: Progressing Goal: Will remain free from infection Outcome: Progressing Goal: Diagnostic test results will improve Outcome: Progressing Goal: Respiratory complications will improve Outcome: Progressing   Problem: Activity: Goal: Risk for activity intolerance will decrease Outcome: Progressing

## 2023-03-07 NOTE — Progress Notes (Signed)
SATURATION QUALIFICATIONS: (This note is used to comply with regulatory documentation for home oxygen)  Patient Saturations on Room Air at Rest = NA; only 89% on 5L at rest  Patient Saturations on Room Air while Ambulating = NA  Patient Saturations on 6 Liters of oxygen while Ambulating = 84%  Please briefly explain why patient needs home oxygen:  Patient currently needs oxygen to maintain oxygen saturation (although even on 6L, she drops to 84%)   Jerolyn Center, PT Acute Rehabilitation Services  Office [phone]

## 2023-03-08 ENCOUNTER — Other Ambulatory Visit (HOSPITAL_COMMUNITY): Payer: Self-pay

## 2023-03-08 DIAGNOSIS — J441 Chronic obstructive pulmonary disease with (acute) exacerbation: Secondary | ICD-10-CM | POA: Diagnosis not present

## 2023-03-08 MED ORDER — PREDNISONE 5 MG PO TABS
ORAL_TABLET | ORAL | 0 refills | Status: AC
  Filled 2023-03-08: qty 65, 18d supply, fill #0

## 2023-03-08 MED ORDER — AZITHROMYCIN 500 MG PO TABS
500.0000 mg | ORAL_TABLET | Freq: Every day | ORAL | 0 refills | Status: AC
Start: 2023-03-08 — End: ?
  Filled 2023-03-08: qty 3, 3d supply, fill #0

## 2023-03-08 NOTE — TOC Transition Note (Signed)
Transition of Care [facility]) - CM/SW Discharge Note   Patient Details  Name: Chelsey Young MRN: [id number] Date of Birth: 1940-09-14  Transition of Care [facility] CM/SW Contact:  Gordy Clement, RN Phone Number: 03/08/2023, 10:00 AM   Clinical Narrative:    Patient will DC to home today Family to transport.  Patient is requiring new O2 and portable will be delivered to room from [employer].  They will arrange home delivery of concentrator after patient arrives home. Home Health PT will be provided by [facility]. No additional  DME has been recommended.   No additional TOC needs             Patient Goals and CMS Choice      Discharge Placement                         Discharge Plan and Services Additional resources added to the After Visit Summary for                                       Social Determinants of Health (SDOH) Interventions SDOH Screenings   Food Insecurity: Patient Unable To Answer (03/06/2023)  Housing: Patient Unable To Answer (03/06/2023)  Transportation Needs: Patient Unable To Answer (03/06/2023)  Utilities: Patient Unable To Answer (03/06/2023)  Depression (PHQ2-9): Low Risk  (03/05/2022)  Tobacco Use: High Risk (03/05/2023)     Readmission Risk Interventions     No data to display

## 2023-03-08 NOTE — Discharge Instructions (Signed)
Follow with Primary MD Merri Brunette, MD in 7 days   Get CBC, CMP, 2 view Chest X ray -  checked next visit with your primary MD   Activity: As tolerated with Full fall precautions use walker/cane & assistance as needed  Disposition Home    Diet: Heart Healthy   Special Instructions: If you have smoked or chewed Tobacco  in the last 2 yrs please stop smoking, stop any regular Alcohol  and or any Recreational drug use.  On your next visit with your primary care physician please Get Medicines reviewed and adjusted.  Please request your Prim.MD to go over all Hospital Tests and Procedure/Radiological results at the follow up, please get all Hospital records sent to your Prim MD by signing hospital release before you go home.  If you experience worsening of your admission symptoms, develop shortness of breath, life threatening emergency, suicidal or homicidal thoughts you must seek medical attention immediately by calling 911 or calling your MD immediately  if symptoms less severe.  You Must read complete instructions/literature along with all the possible adverse reactions/side effects for all the Medicines you take and that have been prescribed to you. Take any new Medicines after you have completely understood and accpet all the possible adverse reactions/side effects.   Do not drive when taking Pain medications.  Do not take more than prescribed Pain, Sleep and Anxiety Medications

## 2023-03-08 NOTE — Progress Notes (Signed)
Physical Therapy Treatment Patient Details Name: Chelsey Young MRN: [id number] DOB: 1941-02-06 Today's Date: 03/08/2023   History of Present Illness 82 y.o. female being admitted 03/06/23 with Acute hypoxic respiratory failure 2/2 exacerbation of COPD, AMS; CT head no acute changes;   PMH osteoarthritis, COPD, hypertension, hyperlipidemia, CVA    PT Comments  Patient standing at recliner without oxygen in nose on arrival. Sats 81%. With cues, pt returned to sitting and required assist to don [place] O2. Pt getting lower body dressed with lots of bending over for donning socks, pants, shoes and required repeated cuing to take breaks and catch her breath/let oxygen recover (dropping down to 83%, recovers to 88% on 5L). Educated on "reeling" in and "letting out" oxygen tubing when walking. Multiple O2 extension tubes placed to simulate home set-up. Pt with loss of balance twice in 60 ft of walking requiring min assist to recover (once stepped on tubing, once looking down to manage tubing). Spoke with daughter 8/18 re: concerns over safety with oxygen tubing (if pt will even wear it) and recommended 24/7 supervision on initial discharge home. Daughter reported family could provide this although today pt states "no one is going to stay with me." RN informed re: session and safety issues.    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 per pt Restrictions Weight Bearing Restrictions: No     Mobility  Bed Mobility                General bed mobility comments: up in chair    Transfers Overall transfer level: Needs assistance Equipment used: None Transfers: Sit to/from Stand Sit to Stand: Contact guard assist           General transfer comment: guarding assist and cues how to manage O2 tubing    Ambulation/Gait Ambulation/Gait assistance: Min assist Gait Distance (Feet): 60 Feet Assistive device: None Gait Pattern/deviations: Step-through pattern, Antalgic, Drifts right/left, Staggering left       General Gait Details: with shoes; pt stepped on tubing x1 with LOB and min assist to recover; second LOB when looking down at tubing with min assist to recover   Stairs             Wheelchair Mobility     Tilt Bed    Modified Rankin (Stroke Patients Only)       Balance Overall balance assessment: Mild deficits observed, not formally tested (during gait (see above))                                          Cognition Arousal: Alert Behavior During Therapy: WFL for tasks assessed/performed Overall Cognitive Status: No family/caregiver present to determine baseline cognitive functioning                                 General Comments: not wearing O2 on arrival with sats 81% dyspnea  3/4; verbalizes that she will wear O2 at all times at home due to not wanting to come back to hospital        Exercises      General Comments General comments (skin integrity, edema, etc.): on 5L; required assist to don O2 on initial arrival with sats 81%; slowly incr to 88% but pt trying to put her shoes on by bending over and continued to desat with cues to take breaks and pursed lip breath; during ambulation sats >=88% on 5L      Pertinent Vitals/Pain      Home Living                          Prior Function            PT Goals (current goals can now be found in the care plan section) Acute Rehab PT Goals Patient Stated Goal: go home without  oxygen Time For Goal Achievement: 03/21/23 Potential to Achieve Goals: Good Progress towards PT goals: Progressing toward goals    Frequency    Min 1X/week      PT Plan      Co-evaluation              AM-PAC PT "6 Clicks" Mobility   Outcome Measure  Help needed turning from your back to your side while in a flat bed without using bedrails?: None Help needed moving from lying on your back to sitting on the side of a flat bed without using bedrails?: None Help needed moving to and from a bed to a chair (including a wheelchair)?: A Little Help needed standing up from a chair using your arms (e.g., wheelchair or bedside chair)?: A Little Help needed to walk in hospital room?: A Little Help needed climbing 3-5 steps with a railing? : A Little 6 Click Score: 20    End of Session Equipment Utilized During Treatment: Oxygen Activity Tolerance: Treatment limited secondary to medical complications (Comment) (desaturates with activity and requires cues for rest breaks) Patient left: in chair;with call bell/phone within reach Nurse Communication: Mobility status;Other (comment) (relay to family that she is losing her balance when trying to manage O2 tubing) PT Visit Diagnosis: Difficulty in walking, not elsewhere classified (R26.2)     Time: 1610-9604 PT Time Calculation (min) (ACUTE ONLY): 18 min  Charges:    $Gait Training: 8-22 mins PT General Charges $$ ACUTE PT VISIT: 1 Visit                      Jerolyn Center, PT Acute Rehabilitation Services  Office [phone]    Zena Amos 03/08/2023, 8:41 AM

## 2023-03-08 NOTE — Discharge Summary (Signed)
NATALLY HELLENBRAND Young:[id number] DOB: 1940-09-12 DOA: 03/05/2023  PCP: Merri Brunette, MD  Admit date: 03/05/2023  Discharge date: 03/08/2023  Admitted From: Home   Disposition:  Home   Recommendations for Outpatient Follow-up:   Follow up with PCP in 1-2 weeks  PCP Please obtain BMP/CBC, 2 view CXR in 1week,  (see Discharge instructions)   PCP Please follow up on the following pending results:    Home Health: PT, RN if qulifies   Equipment/Devices: o2  Consultations: None  Discharge Condition: Stable    CODE STATUS: Full    Diet Recommendation: Heart Healthy     Chief Complaint  Patient presents with   Shortness of Breath   Leg Pain   Cough     Brief history of present illness from the day of admission and additional interim summary    82 y.o. female with a history of osteoarthritis, COPD, hypertension, hyperlipidemia, CVA  now being admitted with Acute hypoxic respiratory failure 2/2 exacerbation of COPD, had a new oxygen requirement and was admitted to the hospital.                                                                  Hospital Course   Acute hypoxic respiratory failure due to COPD exacerbation.  She is being treated with IV steroids, oral azithromycin along with supportive care with nebulizer treatments, clinically improving, encouraged to advance activity, use I-S and flutter valve sitting in chair, she is much improved but still requiring 2 L of oxygen on minimal ambulation, she will be discharged home on oral steroid taper, home oxygen along with 3 more days of oral azithromycin with PCP follow-up.      Acute metabolic encephalopathy.  Due to #1 above.  Resolved with supportive care.  CT head nonacute.  Back to her baseline.   Minimally elevated D-dimer.  Likely due to inflammation from  #1 above.  Lower extremity venous duplex negative.  No further workup.  Prophylactic Lovenox started.   AKI versus CKD 3 A.  Baseline creatinine stable around 1.3, PCP to monitor.   Hypokalemia.  Replaced.   Hypertension.  Resume home diuretic, PCP to monitor.   Dyslipidemia.  On statin.   GERD.  Continue PPI    Discharge diagnosis     Principal Problem:   COPD exacerbation (HCC) Active Problems:   Acute respiratory failure with hypoxia [facility])    Discharge instructions    Discharge Instructions     Diet - low sodium heart healthy   Complete by: As directed    Discharge instructions   Complete by: As directed    Follow with Primary MD Merri Brunette, MD in 7 days   Get CBC, CMP, 2 view Chest X ray -  checked next visit with your primary MD  Activity: As tolerated with Full fall precautions use walker/cane & assistance as needed  Disposition Home    Diet: Heart Healthy   Special Instructions: If you have smoked or chewed Tobacco  in the last 2 yrs please stop smoking, stop any regular Alcohol  and or any Recreational drug use.  On your next visit with your primary care physician please Get Medicines reviewed and adjusted.  Please request your Prim.MD to go over all Hospital Tests and Procedure/Radiological results at the follow up, please get all Hospital records sent to your Prim MD by signing hospital release before you go home.  If you experience worsening of your admission symptoms, develop shortness of breath, life threatening emergency, suicidal or homicidal thoughts you must seek medical attention immediately by calling 911 or calling your MD immediately  if symptoms less severe.  You Must read complete instructions/literature along with all the possible adverse reactions/side effects for all the Medicines you take and that have been prescribed to you. Take any new Medicines after you have completely understood and accpet all the possible adverse reactions/side  effects.   Do not drive when taking Pain medications.  Do not take more than prescribed Pain, Sleep and Anxiety Medications   Increase activity slowly   Complete by: As directed        Discharge Medications   Allergies as of 03/08/2023   No Known Allergies      Medication List     TAKE these medications    albuterol (2.5 MG/3ML) 0.083% nebulizer solution Commonly known as: PROVENTIL Take 2.5 mg by nebulization daily.   aspirin 81 MG tablet Take 81 mg by mouth daily.   azithromycin 500 MG tablet Commonly known as: ZITHROMAX Take 1 tablet (500 mg total) by mouth daily.   Breztri Aerosphere 160-9-4.8 MCG/ACT Aero Generic drug: Budeson-Glycopyrrol-Formoterol Inhale 2 puffs into the lungs 2 (two) times daily.   CALCIUM + VITAMIN D3 PO Take 1 tablet by mouth daily.   POTASSIUM PO Take 1 tablet by mouth daily.   predniSONE 5 MG tablet Commonly known as: DELTASONE Label  & dispense according to the schedule below. take 8 Pills PO for 3 days, 6 Pills PO for 3 days, 4 Pills PO for 3 days, 2 Pills PO for 3 days, 1 Pills PO for 3 days, 1/2 Pill  PO for 3 days then STOP. Total 65 pills.   PRESERVISION AREDS 2 PO Take 1 capsule by mouth daily.   simvastatin 40 MG tablet Commonly known as: ZOCOR Take 40 mg by mouth every evening.   triamterene-hydrochlorothiazide 37.5-25 MG tablet Commonly known as: MAXZIDE-25 Take 1 tablet by mouth daily.   TYLENOL PO Take 2 tablets by mouth 2 (two) times daily as needed (pain, headache, fever).               Durable Medical Equipment  (From admission, onward)           Start     Ordered   03/08/23 0516  For home use only DME oxygen  Once       Question Answer Comment  Length of Need 6 Months   Mode or (Route) Nasal cannula   Liters per Minute 2   Frequency Continuous (stationary and portable oxygen unit needed)   Oxygen conserving device Yes   Oxygen delivery system Gas      03/08/23 0515              Follow-up Information     Pharr,  Zollie Beckers, MD. Schedule an appointment as soon as possible for a visit in 1 week(s).   Specialty: Internal Medicine Contact information: [address] [place] [address] [place] [address] [phone]                 Major procedures and Radiology Reports - PLEASE review detailed and final reports thoroughly  -      DG Chest [facility] 1 View  Result Date: 03/07/2023 CLINICAL DATA:  Shortness of breath EXAM: PORTABLE CHEST 1 VIEW COMPARISON:  Two days ago FINDINGS: Normal heart size and mediastinal contours. No acute infiltrate or edema. No effusion or pneumothorax. No acute osseous findings. Artifact from EKG leads. Minimally covered abdominal aortic stent graft. IMPRESSION: No active disease. Electronically Signed   By: Tiburcio Pea M.D.   On: 03/07/2023 07:34   VAS [place] LOWER EXTREMITY VENOUS (DVT)  Result Date: 03/06/2023  Lower Venous DVT Study Patient Name:  Chelsey Young  Date of Exam:   03/06/2023 Medical Rec #: [id number]            Accession #:    [id number] Date of Birth: 04-18-1941            Patient Gender: F Patient Age:   35 years Exam Location:  [facility] Procedure:      VAS [place] LOWER EXTREMITY VENOUS (DVT) Referring Phys: ALEXIS HUGELMEYER --------------------------------------------------------------------------------  Indications: Positive D-dimer (0.83).  Comparison Study: No previous exams Performing Technologist: Jody Hill RVT, RDMS  Examination Guidelines: A complete evaluation includes B-mode imaging, spectral Doppler, color Doppler, and power Doppler as needed of all accessible portions of each vessel. Bilateral testing is considered an integral part of a complete examination. Limited examinations for reoccurring indications may be performed as noted. The reflux portion of the exam is performed with the patient in reverse Trendelenburg.  +---------+---------------+---------+-----------+----------+--------------+ RIGHT     CompressibilityPhasicitySpontaneityPropertiesThrombus Aging +---------+---------------+---------+-----------+----------+--------------+ CFV      Full           Yes      Yes                                 +---------+---------------+---------+-----------+----------+--------------+ SFJ      Full                                                        +---------+---------------+---------+-----------+----------+--------------+ FV Prox  Full           Yes      Yes                                 +---------+---------------+---------+-----------+----------+--------------+ FV Mid   Full           Yes      Yes                                 +---------+---------------+---------+-----------+----------+--------------+ FV DistalFull           Yes      Yes                                 +---------+---------------+---------+-----------+----------+--------------+  PFV      Full                                                        +---------+---------------+---------+-----------+----------+--------------+ POP      Full           Yes      Yes                                 +---------+---------------+---------+-----------+----------+--------------+ PTV      Full                                                        +---------+---------------+---------+-----------+----------+--------------+ PERO     Full                                                        +---------+---------------+---------+-----------+----------+--------------+   +---------+---------------+---------+-----------+----------+--------------+ LEFT     CompressibilityPhasicitySpontaneityPropertiesThrombus Aging +---------+---------------+---------+-----------+----------+--------------+ CFV      Full           Yes      Yes                                 +---------+---------------+---------+-----------+----------+--------------+ SFJ      Full                                                         +---------+---------------+---------+-----------+----------+--------------+ FV Prox  Full           Yes      Yes                                 +---------+---------------+---------+-----------+----------+--------------+ FV Mid   Full           Yes      Yes                                 +---------+---------------+---------+-----------+----------+--------------+ FV DistalFull           Yes      Yes                                 +---------+---------------+---------+-----------+----------+--------------+ PFV      Full                                                        +---------+---------------+---------+-----------+----------+--------------+  POP      Full           Yes      Yes                                 +---------+---------------+---------+-----------+----------+--------------+ PTV      Full                                                        +---------+---------------+---------+-----------+----------+--------------+ PERO     Full                                                        +---------+---------------+---------+-----------+----------+--------------+     Summary: BILATERAL: - No evidence of deep vein thrombosis seen in the lower extremities, bilaterally. -No evidence of popliteal cyst, bilaterally.   *See table(s) above for measurements and observations. Electronically signed by Lemar Livings MD on 03/06/2023 at 3:04:14 PM.    Final    CT Head Wo Contrast  Result Date: 03/05/2023 CLINICAL DATA:  Altered mental status. EXAM: CT HEAD WITHOUT CONTRAST TECHNIQUE: Contiguous axial images were obtained from the base of the skull through the vertex without intravenous contrast. RADIATION DOSE REDUCTION: This exam was performed according to the departmental dose-optimization program which includes automated exposure control, adjustment of the mA and/or kV according to patient size and/or use of iterative reconstruction technique.  COMPARISON:  None Available. FINDINGS: Brain: There is mild cerebral atrophy with widening of the extra-axial spaces and ventricular dilatation. There are areas of decreased attenuation within the white matter tracts of the supratentorial brain, consistent with microvascular disease changes. A chronic right parietooccipital infarct is seen. Vascular: There is moderate to marked severity calcification of the bilateral cavernous carotid arteries. Skull: Normal. Negative for fracture or focal lesion. Sinuses/Orbits: No acute finding. Other: None. IMPRESSION: 1. Generalized cerebral atrophy with chronic white matter small vessel ischemic changes. 2. Chronic right parietooccipital infarct. 3. No acute intracranial abnormality. Electronically Signed   By: Aram Candela M.D.   On: 03/05/2023 23:39   DG Chest 2 View  Result Date: 03/05/2023 CLINICAL DATA:  Dyspnea EXAM: CHEST - 2 VIEW COMPARISON:  05/09/2013 chest radiograph. FINDINGS: Stable cardiomediastinal silhouette with normal heart size. No pneumothorax. No pleural effusion. Mildly hyperinflated lungs. No pulmonary edema. No acute consolidative airspace disease. Partially visualized abdominal aortic stent graft. Chronic severe L1 vertebral compression fracture. IMPRESSION: Mildly hyperinflated lungs, cannot exclude COPD. Otherwise no active cardiopulmonary disease. Electronically Signed   By: Delbert Phenix M.D.   On: 03/05/2023 19:31   OCT, Retina - OU - Both Eyes  Result Date: 02/12/2023 Right Eye Quality was good. Central Foveal Thickness: 351. Progression has worsened. Findings include no IRF, no SRF, abnormal foveal contour, retinal drusen , epiretinal membrane, macular pucker, outer retinal atrophy (Interval progression of central GA seen best on en face imaging, mild ERM and pucker -- stable). Left Eye Quality was good. Central Foveal Thickness: 304. Progression has worsened. Findings include no IRF, no SRF, abnormal foveal contour, retinal drusen ,  epiretinal membrane, outer retinal atrophy (Mild progression of ORA/GA subfovea ).  Notes *Images captured and stored on drive Diagnosis / Impression: Non exudative ARMD OU OD: Interval progression of central GA seen best on en face imaging, mild ERM and pucker -- stable OS: Mild progression of ORA/GA subfovea Clinical management: See below Abbreviations: NFP - Normal foveal profile. CME - cystoid macular edema. PED - pigment epithelial detachment. IRF - intraretinal fluid. SRF - subretinal fluid. EZ - ellipsoid zone. ERM - epiretinal membrane. ORA - outer retinal atrophy. ORT - outer retinal tubulation. SRHM - subretinal hyper-reflective material    Micro Results    Recent Results (from the past 240 hour(s))  Expectorated Sputum Assessment w Gram Stain, Rflx to Resp Cult     Status: None   Collection Time: 03/06/23 12:22 AM   Specimen: Expectorated Sputum  Result Value Ref Range Status   Specimen Description EXPECTORATED SPUTUM  Final   Special Requests NONE  Final   Sputum evaluation   Final    THIS SPECIMEN IS ACCEPTABLE FOR SPUTUM CULTURE Performed at [facility], [address], [place], [place] [address]    Report Status 03/07/2023 FINAL  Final  Respiratory (~20 pathogens) panel by PCR     Status: None   Collection Time: 03/06/23  1:04 AM   Specimen: Nasopharyngeal Swab; Respiratory  Result Value Ref Range Status   Adenovirus NOT DETECTED NOT DETECTED Final   Coronavirus 229E NOT DETECTED NOT DETECTED Final    Comment: (NOTE) The Coronavirus on the Respiratory Panel, DOES NOT test for the novel  Coronavirus (2019 nCoV)    Coronavirus HKU1 NOT DETECTED NOT DETECTED Final   Coronavirus NL63 NOT DETECTED NOT DETECTED Final   Coronavirus OC43 NOT DETECTED NOT DETECTED Final   Metapneumovirus NOT DETECTED NOT DETECTED Final   Rhinovirus / Enterovirus NOT DETECTED NOT DETECTED Final   Influenza A NOT DETECTED NOT DETECTED Final   Influenza B NOT DETECTED NOT DETECTED Final    Parainfluenza Virus 1 NOT DETECTED NOT DETECTED Final   Parainfluenza Virus 2 NOT DETECTED NOT DETECTED Final   Parainfluenza Virus 3 NOT DETECTED NOT DETECTED Final   Parainfluenza Virus 4 NOT DETECTED NOT DETECTED Final   Respiratory Syncytial Virus NOT DETECTED NOT DETECTED Final   Bordetella pertussis NOT DETECTED NOT DETECTED Final   Bordetella Parapertussis NOT DETECTED NOT DETECTED Final   Chlamydophila pneumoniae NOT DETECTED NOT DETECTED Final   Mycoplasma pneumoniae NOT DETECTED NOT DETECTED Final    Comment: Performed at [facility], [address] [place], [place] [address]  Resp panel by RT-PCR (RSV, Flu A&B, Covid) Nasopharyngeal Swab     Status: None   Collection Time: 03/06/23  1:04 AM   Specimen: Nasopharyngeal Swab; Nasal Swab  Result Value Ref Range Status   SARS Coronavirus 2 by RT PCR NEGATIVE NEGATIVE Final   Influenza A by PCR NEGATIVE NEGATIVE Final   Influenza B by PCR NEGATIVE NEGATIVE Final    Comment: (NOTE) The Xpert Xpress SARS-CoV-2/FLU/RSV plus assay is intended as an aid in the diagnosis of influenza from Nasopharyngeal swab specimens and should not be used as a sole basis for treatment. Nasal washings and aspirates are unacceptable for Xpert Xpress SARS-CoV-2/FLU/RSV testing.  Fact Sheet for Patients: BloggerCourse.com  Fact Sheet for Healthcare Providers: SeriousBroker.it  This test is not yet approved or cleared by the [place] FDA and has been authorized for detection and/or diagnosis of SARS-CoV-2 by FDA under an Emergency Use Authorization (EUA). This EUA will remain in effect (meaning this test  can be used) for the duration of the COVID-19 declaration under Section 564(b)(1) of the Act, 21 U.S.C. section 360bbb-3(b)(1), unless the authorization is terminated or revoked.     Resp Syncytial Virus by PCR NEGATIVE NEGATIVE Final    Comment: (NOTE) Fact Sheet for  Patients: BloggerCourse.com  Fact Sheet for Healthcare Providers: SeriousBroker.it  This test is not yet approved or cleared by the [place] FDA and has been authorized for detection and/or diagnosis of SARS-CoV-2 by FDA under an Emergency Use Authorization (EUA). This EUA will remain in effect (meaning this test can be used) for the duration of the COVID-19 declaration under Section 564(b)(1) of the Act, 21 U.S.C. section 360bbb-3(b)(1), unless the authorization is terminated or revoked.  Performed at [facility], [address] [place], [place] [address]     Today   Subjective    Edwardine Hewell today has no headache,no chest abdominal pain,no new weakness tingling or numbness, feels much better wants to go home today.    Objective   Blood pressure (!) 144/64, pulse 89, temperature 98.2 F (36.8 C), temperature source Oral, resp. rate 20, height 5\' 2"  (1.575 m), weight 52.2 kg, SpO2 92%.  No intake or output data in the 24 hours ending 03/08/23 0842  Exam  Awake Alert, No new F.N deficits,    [place].AT,PERRAL Supple Neck,   Symmetrical Chest wall movement, Good air movement bilaterally, minimal wheezing RRR,No Gallops,   +ve B.Sounds, Abd Soft, Non tender,  No Cyanosis, Clubbing or edema    Data Review   Recent Labs  Lab 03/05/23 1835 03/05/23 2152 03/06/23 0325  WBC 8.5  --  6.5  HGB 13.9 13.6 13.8  HCT 44.1 40.0 43.2  PLT 228  --  223  MCV 93.0  --  92.7  MCH 29.3  --  29.6  MCHC 31.5  --  31.9  RDW 12.9  --  13.1    Recent Labs  Lab 03/05/23 1835 03/05/23 2037 03/05/23 2152 03/05/23 2321 03/06/23 0325 03/07/23 0547  NA 139  --  140  --  140  --   K 3.5  --  3.3*  --  3.5  --   CL 98  --   --   --  99  --   CO2 27  --   --   --  25  --   ANIONGAP 14  --   --   --  16*  --   GLUCOSE 192*  --   --   --  190*  --   BUN 16  --   --   --  17  --   CREATININE 1.31*  --   --   --  1.34*   --   CRP  --   --   --   --   --  <0.5  DDIMER  --   --   --  0.83*  --   --   PROCALCITON  --   --   --   --   --  <0.10  BNP  --  83.1  --   --   --  182.5*  MG  --   --   --   --  2.3  --   CALCIUM 9.1  --   --   --  9.0  --     Total Time in preparing paper work, data evaluation and todays exam - 35 minutes  Signature  -  Susa Raring M.D on 03/08/2023 at 8:42 AM   -  To page go to [email or web address]

## 2023-03-10 LAB — CULTURE, RESPIRATORY W GRAM STAIN: Culture: NORMAL

## 2023-03-15 DIAGNOSIS — K219 Gastro-esophageal reflux disease without esophagitis: Secondary | ICD-10-CM | POA: Diagnosis not present

## 2023-03-15 DIAGNOSIS — E7800 Pure hypercholesterolemia, unspecified: Secondary | ICD-10-CM | POA: Diagnosis not present

## 2023-03-15 DIAGNOSIS — Z09 Encounter for follow-up examination after completed treatment for conditions other than malignant neoplasm: Secondary | ICD-10-CM | POA: Diagnosis not present

## 2023-03-15 DIAGNOSIS — I129 Hypertensive chronic kidney disease with stage 1 through stage 4 chronic kidney disease, or unspecified chronic kidney disease: Secondary | ICD-10-CM | POA: Diagnosis not present

## 2023-03-15 DIAGNOSIS — N179 Acute kidney failure, unspecified: Secondary | ICD-10-CM | POA: Diagnosis not present

## 2023-03-15 DIAGNOSIS — N1831 Chronic kidney disease, stage 3a: Secondary | ICD-10-CM | POA: Diagnosis not present

## 2023-03-15 DIAGNOSIS — J441 Chronic obstructive pulmonary disease with (acute) exacerbation: Secondary | ICD-10-CM | POA: Diagnosis not present

## 2023-03-15 DIAGNOSIS — J9601 Acute respiratory failure with hypoxia: Secondary | ICD-10-CM | POA: Diagnosis not present

## 2023-03-15 DIAGNOSIS — M1990 Unspecified osteoarthritis, unspecified site: Secondary | ICD-10-CM | POA: Diagnosis not present

## 2023-03-15 DIAGNOSIS — E876 Hypokalemia: Secondary | ICD-10-CM | POA: Diagnosis not present

## 2023-03-24 DIAGNOSIS — M1990 Unspecified osteoarthritis, unspecified site: Secondary | ICD-10-CM | POA: Diagnosis not present

## 2023-03-24 DIAGNOSIS — K219 Gastro-esophageal reflux disease without esophagitis: Secondary | ICD-10-CM | POA: Diagnosis not present

## 2023-03-24 DIAGNOSIS — J9601 Acute respiratory failure with hypoxia: Secondary | ICD-10-CM | POA: Diagnosis not present

## 2023-03-24 DIAGNOSIS — E876 Hypokalemia: Secondary | ICD-10-CM | POA: Diagnosis not present

## 2023-03-24 DIAGNOSIS — E7800 Pure hypercholesterolemia, unspecified: Secondary | ICD-10-CM | POA: Diagnosis not present

## 2023-03-24 DIAGNOSIS — J441 Chronic obstructive pulmonary disease with (acute) exacerbation: Secondary | ICD-10-CM | POA: Diagnosis not present

## 2023-03-24 DIAGNOSIS — I129 Hypertensive chronic kidney disease with stage 1 through stage 4 chronic kidney disease, or unspecified chronic kidney disease: Secondary | ICD-10-CM | POA: Diagnosis not present

## 2023-03-24 DIAGNOSIS — N179 Acute kidney failure, unspecified: Secondary | ICD-10-CM | POA: Diagnosis not present

## 2023-03-24 DIAGNOSIS — N1831 Chronic kidney disease, stage 3a: Secondary | ICD-10-CM | POA: Diagnosis not present

## 2023-04-08 DIAGNOSIS — M1990 Unspecified osteoarthritis, unspecified site: Secondary | ICD-10-CM | POA: Diagnosis not present

## 2023-04-08 DIAGNOSIS — E876 Hypokalemia: Secondary | ICD-10-CM | POA: Diagnosis not present

## 2023-04-08 DIAGNOSIS — J441 Chronic obstructive pulmonary disease with (acute) exacerbation: Secondary | ICD-10-CM | POA: Diagnosis not present

## 2023-04-08 DIAGNOSIS — N179 Acute kidney failure, unspecified: Secondary | ICD-10-CM | POA: Diagnosis not present

## 2023-04-08 DIAGNOSIS — N1831 Chronic kidney disease, stage 3a: Secondary | ICD-10-CM | POA: Diagnosis not present

## 2023-04-08 DIAGNOSIS — J449 Chronic obstructive pulmonary disease, unspecified: Secondary | ICD-10-CM | POA: Diagnosis not present

## 2023-04-08 DIAGNOSIS — K219 Gastro-esophageal reflux disease without esophagitis: Secondary | ICD-10-CM | POA: Diagnosis not present

## 2023-04-08 DIAGNOSIS — E7800 Pure hypercholesterolemia, unspecified: Secondary | ICD-10-CM | POA: Diagnosis not present

## 2023-04-08 DIAGNOSIS — I129 Hypertensive chronic kidney disease with stage 1 through stage 4 chronic kidney disease, or unspecified chronic kidney disease: Secondary | ICD-10-CM | POA: Diagnosis not present

## 2023-04-08 DIAGNOSIS — J9601 Acute respiratory failure with hypoxia: Secondary | ICD-10-CM | POA: Diagnosis not present

## 2023-04-14 DIAGNOSIS — N1831 Chronic kidney disease, stage 3a: Secondary | ICD-10-CM | POA: Diagnosis not present

## 2023-04-14 DIAGNOSIS — J441 Chronic obstructive pulmonary disease with (acute) exacerbation: Secondary | ICD-10-CM | POA: Diagnosis not present

## 2023-04-14 DIAGNOSIS — K219 Gastro-esophageal reflux disease without esophagitis: Secondary | ICD-10-CM | POA: Diagnosis not present

## 2023-04-14 DIAGNOSIS — I129 Hypertensive chronic kidney disease with stage 1 through stage 4 chronic kidney disease, or unspecified chronic kidney disease: Secondary | ICD-10-CM | POA: Diagnosis not present

## 2023-04-14 DIAGNOSIS — N179 Acute kidney failure, unspecified: Secondary | ICD-10-CM | POA: Diagnosis not present

## 2023-04-14 DIAGNOSIS — E876 Hypokalemia: Secondary | ICD-10-CM | POA: Diagnosis not present

## 2023-04-14 DIAGNOSIS — M1990 Unspecified osteoarthritis, unspecified site: Secondary | ICD-10-CM | POA: Diagnosis not present

## 2023-04-14 DIAGNOSIS — J9601 Acute respiratory failure with hypoxia: Secondary | ICD-10-CM | POA: Diagnosis not present

## 2023-04-14 DIAGNOSIS — E7800 Pure hypercholesterolemia, unspecified: Secondary | ICD-10-CM | POA: Diagnosis not present

## 2023-04-19 DIAGNOSIS — J441 Chronic obstructive pulmonary disease with (acute) exacerbation: Secondary | ICD-10-CM | POA: Diagnosis not present

## 2023-04-19 DIAGNOSIS — I129 Hypertensive chronic kidney disease with stage 1 through stage 4 chronic kidney disease, or unspecified chronic kidney disease: Secondary | ICD-10-CM | POA: Diagnosis not present

## 2023-04-19 DIAGNOSIS — J9601 Acute respiratory failure with hypoxia: Secondary | ICD-10-CM | POA: Diagnosis not present

## 2023-04-19 DIAGNOSIS — N179 Acute kidney failure, unspecified: Secondary | ICD-10-CM | POA: Diagnosis not present

## 2023-04-22 DIAGNOSIS — I129 Hypertensive chronic kidney disease with stage 1 through stage 4 chronic kidney disease, or unspecified chronic kidney disease: Secondary | ICD-10-CM | POA: Diagnosis not present

## 2023-04-22 DIAGNOSIS — N179 Acute kidney failure, unspecified: Secondary | ICD-10-CM | POA: Diagnosis not present

## 2023-04-22 DIAGNOSIS — M1990 Unspecified osteoarthritis, unspecified site: Secondary | ICD-10-CM | POA: Diagnosis not present

## 2023-04-22 DIAGNOSIS — E876 Hypokalemia: Secondary | ICD-10-CM | POA: Diagnosis not present

## 2023-04-22 DIAGNOSIS — K219 Gastro-esophageal reflux disease without esophagitis: Secondary | ICD-10-CM | POA: Diagnosis not present

## 2023-04-22 DIAGNOSIS — J9601 Acute respiratory failure with hypoxia: Secondary | ICD-10-CM | POA: Diagnosis not present

## 2023-04-22 DIAGNOSIS — N1831 Chronic kidney disease, stage 3a: Secondary | ICD-10-CM | POA: Diagnosis not present

## 2023-04-22 DIAGNOSIS — E7800 Pure hypercholesterolemia, unspecified: Secondary | ICD-10-CM | POA: Diagnosis not present

## 2023-04-22 DIAGNOSIS — J441 Chronic obstructive pulmonary disease with (acute) exacerbation: Secondary | ICD-10-CM | POA: Diagnosis not present

## 2023-04-29 DIAGNOSIS — I129 Hypertensive chronic kidney disease with stage 1 through stage 4 chronic kidney disease, or unspecified chronic kidney disease: Secondary | ICD-10-CM | POA: Diagnosis not present

## 2023-04-29 DIAGNOSIS — E876 Hypokalemia: Secondary | ICD-10-CM | POA: Diagnosis not present

## 2023-04-29 DIAGNOSIS — E7800 Pure hypercholesterolemia, unspecified: Secondary | ICD-10-CM | POA: Diagnosis not present

## 2023-04-29 DIAGNOSIS — N1831 Chronic kidney disease, stage 3a: Secondary | ICD-10-CM | POA: Diagnosis not present

## 2023-04-29 DIAGNOSIS — J9601 Acute respiratory failure with hypoxia: Secondary | ICD-10-CM | POA: Diagnosis not present

## 2023-04-29 DIAGNOSIS — K219 Gastro-esophageal reflux disease without esophagitis: Secondary | ICD-10-CM | POA: Diagnosis not present

## 2023-04-29 DIAGNOSIS — J441 Chronic obstructive pulmonary disease with (acute) exacerbation: Secondary | ICD-10-CM | POA: Diagnosis not present

## 2023-04-29 DIAGNOSIS — N179 Acute kidney failure, unspecified: Secondary | ICD-10-CM | POA: Diagnosis not present

## 2023-04-29 DIAGNOSIS — M1990 Unspecified osteoarthritis, unspecified site: Secondary | ICD-10-CM | POA: Diagnosis not present

## 2023-05-04 ENCOUNTER — Encounter: Payer: Self-pay | Admitting: Pulmonary Disease

## 2023-05-04 ENCOUNTER — Ambulatory Visit (INDEPENDENT_AMBULATORY_CARE_PROVIDER_SITE_OTHER): Payer: Medicare PPO | Admitting: Pulmonary Disease

## 2023-05-04 VITALS — BP 134/80 | HR 77 | Temp 97.8°F | Ht 62.0 in | Wt 124.0 lb

## 2023-05-04 DIAGNOSIS — F17210 Nicotine dependence, cigarettes, uncomplicated: Secondary | ICD-10-CM | POA: Diagnosis not present

## 2023-05-04 DIAGNOSIS — J441 Chronic obstructive pulmonary disease with (acute) exacerbation: Secondary | ICD-10-CM

## 2023-05-04 DIAGNOSIS — J9601 Acute respiratory failure with hypoxia: Secondary | ICD-10-CM | POA: Diagnosis not present

## 2023-05-04 DIAGNOSIS — J42 Unspecified chronic bronchitis: Secondary | ICD-10-CM

## 2023-05-04 MED ORDER — VARENICLINE TARTRATE (STARTER) 0.5 MG X 11 & 1 MG X 42 PO TBPK: ORAL_TABLET | ORAL | 0 refills | Status: AC

## 2023-05-04 NOTE — Patient Instructions (Signed)
VISIT SUMMARY:  During your recent visit, we discussed your chronic obstructive pulmonary disease (COPD), your efforts to quit smoking, your chronic kidney disease, and the possibility of a one-time lung cancer screening due to your long history of smoking. You are feeling well after your recent hospitalization for pneumonia and a COPD exacerbation.  YOUR PLAN:  -COPD: COPD is a chronic lung disease that makes it hard to breathe. We will continue your current medications, Breztri and Albuterol nebulizer. I encourage you to use the Albuterol nebulizer more frequently during episodes of wheezing. We will also schedule a pulmonary function test in 3 months to assess your lung function.  -TOBACCO USE: Quitting smoking can be challenging, but it's one of the best things you can do for your health. To help you manage your cravings, we will start you on Chantix, a medication that can help reduce cravings and withdrawal symptoms. Please continue using the nicotine patches as well.  -CHRONIC KIDNEY DISEASE: Chronic kidney disease is a condition where your kidneys are damaged and can't filter blood as well as they should. There are no changes to your current management of this condition.  -LUNG CANCER SCREENING: Given your long history of smoking, we will order a one-time CT scan of your chest. This is to check for any signs of lung cancer, even though you are beyond the usual age for annual screening.  INSTRUCTIONS:  Please continue taking your medications as prescribed. Start taking Chantix to help with your smoking cessation efforts. We will schedule a pulmonary function test in 3 months and a one-time CT scan of your chest. Please contact the office if you have any questions or concerns.

## 2023-05-04 NOTE — Progress Notes (Signed)
Chelsey Young    [id number]    [birth year]  Primary Care Physician:Pharr, Zollie Beckers, MD  Referring Physician: Merri Brunette, MD [address] [place],  [place] [address]  Chief complaint: Consult for COPD, posthospitalization follow-up  HPI: 82 y.o. who  has a past medical history of Arthritis, Cancer (HCC), COPD (chronic obstructive pulmonary disease) (HCC), Hypercholesteremia, Hypertension, Hypertensive retinopathy, Hypokalemia, Macular degeneration, Stroke [facility]), Thoracic aortic aneurysm without rupture (HCC) (10/29/2016), and Vitamin D deficiency.   Discussed the use of AI scribe software for clinical note transcription with the patient, who gave verbal consent to proceed.  The patient, with a history of COPD presents for a follow-up after a recent hospitalization for pneumonia and a COPD exacerbation in August 2024 which is treated with steroids,. She was diagnosed with COPD in 2019 by her primary care physician and has been on Breztri for the past year, after a switch from Trelegy. She also took a course of prednisone for eight days post-hospitalization. She reports feeling well and back to her normal health status.  The patient has a significant smoking history of half a pack a day for approximately sixty years, but quit smoking during her recent hospitalization. She is currently using nicotine patches to manage cravings, but still struggles with strong urges to smoke, particularly in the mornings.  The patient also reports a persistent wheeze, which she manages with twice-daily albuterol nebulizers as needed. She has a nurse visit once a week for check-ups.    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  No feather pillows or comforters Smoking history: 30-pack-year smoker.  Quit in August 2024 Travel history: Originally from [place].  Previously lived in [place] in [place].  No significant  recent travel Relevant family history: No family history of lung disease  Outpatient Encounter Medications as of 05/04/2023  Medication Sig   Acetaminophen (TYLENOL PO) Take 2 tablets by mouth 2 (two) times daily as needed (pain, headache, fever).   albuterol (PROVENTIL) (2.5 MG/3ML) 0.083% nebulizer solution Take 2.5 mg by nebulization daily.   aspirin 81 MG tablet Take 81 mg by mouth daily.   Budeson-Glycopyrrol-Formoterol (BREZTRI AEROSPHERE) 160-9-4.8 MCG/ACT AERO Inhale 2 puffs into the lungs 2 (two) times daily.   Calcium Carb-Cholecalciferol (CALCIUM + VITAMIN D3 PO) Take 1 tablet by mouth daily.   Multiple Vitamins-Minerals (PRESERVISION AREDS 2 PO) Take 1 capsule by mouth daily.   POTASSIUM PO Take 1 tablet by mouth daily.   predniSONE (DELTASONE) 5 MG tablet Take 8 tablets (40 mg) by mouth for 3 days, 6 tabs (30 mg) for 3 days, 4 tabs (20 mg) for 3 days, 2 tabs (10 mg) for 3 days, 1 tablet (5 mg) for 3 days, 1/2 tablet (2.5 mg) for 3 days then STOP   simvastatin (ZOCOR) 40 MG tablet Take 40 mg by mouth every evening.    triamterene-hydrochlorothiazide (MAXZIDE-25) 37.5-25 MG tablet Take 1 tablet by mouth daily.   azithromycin (ZITHROMAX) 500 MG tablet Take 1 tablet (500 mg total) by mouth daily. (Patient not taking: Reported on 05/04/2023)   No facility-administered encounter medications on file as of 05/04/2023.    Allergies as of 05/04/2023   (No Known Allergies)    Past Medical History:  Diagnosis Date   Arthritis    Cancer (HCC)    CERVICAL   COPD (chronic obstructive pulmonary disease) (HCC)    Hypercholesteremia    Hypertension  Hypertensive retinopathy    OU   Hypokalemia    Macular degeneration    Non-exu OU   Stroke [facility])    TIA   Thoracic aortic aneurysm without rupture (HCC) 10/29/2016   PER CT CHEST   Vitamin D deficiency     Past Surgical History:  Procedure Laterality Date   ABDOMINAL AORTIC ENDOVASCULAR STENT GRAFT N/A 02/29/2020   Procedure:  ABDOMINAL AORTIC ENDOVASCULAR STENT GRAFT;  Surgeon: Cephus Shelling, MD;  Location: [facility] OR;  Service: Vascular;  Laterality: N/A;   ABDOMINAL HYSTERECTOMY     WITH BSO   CATARACT EXTRACTION Bilateral 2018   Dr. Vonna Kotyk   CHOLECYSTECTOMY     EYE SURGERY Bilateral 2018   Cat Sx - Dr. Vonna Kotyk    Family History  Problem Relation Age of Onset   Myasthenia gravis Daughter    Epilepsy Son     Social History   Socioeconomic History   Marital status: Widowed    Spouse name: Not on file   Number of children: Not on file   Years of education: Not on file   Highest education level: Not on file  Occupational History   Not on file  Tobacco Use   Smoking status: Every Day    Current packs/day: 0.50    Average packs/day: 0.5 packs/day for 60.0 years (30.0 ttl pk-yrs)    Types: Cigarettes   Smokeless tobacco: Former   Tobacco comments:    Quit smoking on 03/05/2023  Vaping Use   Vaping status: Never Used  Substance and Sexual Activity   Alcohol use: No   Drug use: No   Sexual activity: Not on file  Other Topics Concern   Not on file  Social History Narrative   Not on file   Social Determinants of Health   Financial Resource Strain: Not on file  Food Insecurity: Patient Unable To Answer (03/06/2023)   Hunger Vital Sign    Worried About Running Out of Food in the Last Year: Patient unable to answer    Ran Out of Food in the Last Year: Patient unable to answer  Transportation Needs: Patient Unable To Answer (03/06/2023)   PRAPARE - Transportation    Lack of Transportation (Medical): Patient unable to answer    Lack of Transportation (Non-Medical): Patient unable to answer  Physical Activity: Not on file  Stress: Not on file  Social Connections: Not on file  Intimate Partner Violence: Patient Unable To Answer (03/06/2023)   Humiliation, Afraid, Rape, and Kick questionnaire    Fear of Current or Ex-Partner: Patient unable to answer    Emotionally Abused: Patient unable to answer     Physically Abused: Patient unable to answer    Sexually Abused: Patient unable to answer    Review of systems: Review of Systems  Constitutional: Negative for fever and chills.  HENT: Negative.   Eyes: Negative for blurred vision.  Respiratory: as per HPI  Cardiovascular: Negative for chest pain and palpitations.  Gastrointestinal: Negative for vomiting, diarrhea, blood per rectum. Genitourinary: Negative for dysuria, urgency, frequency and hematuria.  Musculoskeletal: Negative for myalgias, back pain and joint pain.  Skin: Negative for itching and rash.  Neurological: Negative for dizziness, tremors, focal weakness, seizures and loss of consciousness.  Endo/Heme/Allergies: Negative for environmental allergies.  Psychiatric/Behavioral: Negative for depression, suicidal ideas and hallucinations.  All other systems reviewed and are negative.  Physical Exam: Blood pressure 134/80, pulse 77, temperature 97.8 F (36.6 C), temperature source Oral, height 5\' 2"  (1.575  m), weight 124 lb (56.2 kg), SpO2 96%. Gen:      No acute distress HEENT:  EOMI, sclera anicteric Neck:     No masses; no thyromegaly Lungs:   Mild expiratory wheeze CV:         Regular rate and rhythm; no murmurs Abd:      + bowel sounds; soft, non-tender; no palpable masses, no distension Ext:    No edema; adequate peripheral perfusion Skin:      Warm and dry; no rash Neuro: alert and oriented x 3 Psych: normal mood and affect  Data Reviewed: Imaging:  PFTs:  Labs:  Assessment and Plan COPD Recent hospitalization for COPD exacerbation and pneumonia. Currently on Breztri and Albuterol nebulizer. Audible wheezing on exam. -Continue Breztri and Albuterol nebulizer as needed. -Encourage increased use of Albuterol nebulizer during episodes of wheezing. -Schedule pulmonary function test in 3 months.  Tobacco Use Quit smoking in August 2024 after 60 years of half a pack per day. Currently using nicotine patches  but still experiencing cravings.  Time spent counseling-5 minutes.  Reassess at return visit -Start Chantix to aid in smoking cessation. -Continue nicotine patches.  Lung Cancer Screening Long history of smoking. Age over 33, beyond the usual cutoff for annual screening, but considering one-time CT scan due to history. -Order one-time CT scan of the chest to assess for any concerning findings.   Recommendations: Continue Breztri, nebulizer PFTs CT chest Smoking cessation  Chilton Greathouse MD [facility] 05/04/2023, 10:16 AM  CC: Merri Brunette, MD

## 2023-05-07 DIAGNOSIS — N179 Acute kidney failure, unspecified: Secondary | ICD-10-CM | POA: Diagnosis not present

## 2023-05-07 DIAGNOSIS — M1990 Unspecified osteoarthritis, unspecified site: Secondary | ICD-10-CM | POA: Diagnosis not present

## 2023-05-07 DIAGNOSIS — I129 Hypertensive chronic kidney disease with stage 1 through stage 4 chronic kidney disease, or unspecified chronic kidney disease: Secondary | ICD-10-CM | POA: Diagnosis not present

## 2023-05-07 DIAGNOSIS — N1831 Chronic kidney disease, stage 3a: Secondary | ICD-10-CM | POA: Diagnosis not present

## 2023-05-07 DIAGNOSIS — J441 Chronic obstructive pulmonary disease with (acute) exacerbation: Secondary | ICD-10-CM | POA: Diagnosis not present

## 2023-05-07 DIAGNOSIS — E876 Hypokalemia: Secondary | ICD-10-CM | POA: Diagnosis not present

## 2023-05-07 DIAGNOSIS — K219 Gastro-esophageal reflux disease without esophagitis: Secondary | ICD-10-CM | POA: Diagnosis not present

## 2023-05-07 DIAGNOSIS — E7800 Pure hypercholesterolemia, unspecified: Secondary | ICD-10-CM | POA: Diagnosis not present

## 2023-05-07 DIAGNOSIS — J9601 Acute respiratory failure with hypoxia: Secondary | ICD-10-CM | POA: Diagnosis not present

## 2023-05-13 DIAGNOSIS — J441 Chronic obstructive pulmonary disease with (acute) exacerbation: Secondary | ICD-10-CM | POA: Diagnosis not present

## 2023-05-13 DIAGNOSIS — E7800 Pure hypercholesterolemia, unspecified: Secondary | ICD-10-CM | POA: Diagnosis not present

## 2023-05-13 DIAGNOSIS — J9601 Acute respiratory failure with hypoxia: Secondary | ICD-10-CM | POA: Diagnosis not present

## 2023-05-13 DIAGNOSIS — E876 Hypokalemia: Secondary | ICD-10-CM | POA: Diagnosis not present

## 2023-05-13 DIAGNOSIS — M1990 Unspecified osteoarthritis, unspecified site: Secondary | ICD-10-CM | POA: Diagnosis not present

## 2023-05-13 DIAGNOSIS — N179 Acute kidney failure, unspecified: Secondary | ICD-10-CM | POA: Diagnosis not present

## 2023-05-13 DIAGNOSIS — N1831 Chronic kidney disease, stage 3a: Secondary | ICD-10-CM | POA: Diagnosis not present

## 2023-05-13 DIAGNOSIS — K219 Gastro-esophageal reflux disease without esophagitis: Secondary | ICD-10-CM | POA: Diagnosis not present

## 2023-05-13 DIAGNOSIS — I129 Hypertensive chronic kidney disease with stage 1 through stage 4 chronic kidney disease, or unspecified chronic kidney disease: Secondary | ICD-10-CM | POA: Diagnosis not present

## 2023-05-24 DIAGNOSIS — N281 Cyst of kidney, acquired: Secondary | ICD-10-CM | POA: Diagnosis not present

## 2023-06-22 ENCOUNTER — Other Ambulatory Visit: Payer: Medicare PPO

## 2023-07-16 ENCOUNTER — Other Ambulatory Visit (HOSPITAL_BASED_OUTPATIENT_CLINIC_OR_DEPARTMENT_OTHER): Payer: Self-pay

## 2023-07-22 DIAGNOSIS — E559 Vitamin D deficiency, unspecified: Secondary | ICD-10-CM | POA: Diagnosis not present

## 2023-07-22 DIAGNOSIS — E7800 Pure hypercholesterolemia, unspecified: Secondary | ICD-10-CM | POA: Diagnosis not present

## 2023-07-22 DIAGNOSIS — I10 Essential (primary) hypertension: Secondary | ICD-10-CM | POA: Diagnosis not present

## 2023-08-03 ENCOUNTER — Encounter (HOSPITAL_BASED_OUTPATIENT_CLINIC_OR_DEPARTMENT_OTHER): Payer: Medicare HMO

## 2023-08-04 ENCOUNTER — Ambulatory Visit: Payer: Medicare PPO | Admitting: Pulmonary Disease

## 2023-08-09 ENCOUNTER — Encounter (HOSPITAL_BASED_OUTPATIENT_CLINIC_OR_DEPARTMENT_OTHER): Payer: Medicare HMO

## 2023-08-10 DIAGNOSIS — J449 Chronic obstructive pulmonary disease, unspecified: Secondary | ICD-10-CM | POA: Diagnosis not present

## 2023-08-10 DIAGNOSIS — E7800 Pure hypercholesterolemia, unspecified: Secondary | ICD-10-CM | POA: Diagnosis not present

## 2023-08-10 DIAGNOSIS — K219 Gastro-esophageal reflux disease without esophagitis: Secondary | ICD-10-CM | POA: Diagnosis not present

## 2023-08-10 DIAGNOSIS — F5101 Primary insomnia: Secondary | ICD-10-CM | POA: Diagnosis not present

## 2023-08-10 DIAGNOSIS — Z23 Encounter for immunization: Secondary | ICD-10-CM | POA: Diagnosis not present

## 2023-08-10 DIAGNOSIS — E559 Vitamin D deficiency, unspecified: Secondary | ICD-10-CM | POA: Diagnosis not present

## 2023-08-10 DIAGNOSIS — I10 Essential (primary) hypertension: Secondary | ICD-10-CM | POA: Diagnosis not present

## 2023-08-11 NOTE — Progress Notes (Deleted)
[facility] - Clinic Note  08/13/2023     CHIEF COMPLAINT Patient presents for Retina Follow Up  HISTORY OF PRESENT ILLNESS: Chelsey Young is a 83 y.o. female who presents to the clinic today for:   HPI     Retina Follow Up   Patient presents with  Dry AMD.  Severity is severe.  Duration of months.  Since onset it is stable.        Comments   6 month Retina Eval for Armd ou. Patient states no vision changes      Last edited by Lana Fish, COT on 08/13/2023  8:58 AM.       Referring physician: Merri Brunette, MD [address] [place],  [place] [address]  HISTORICAL INFORMATION:   Selected notes from the MEDICAL RECORD NUMBER Referred by Dr. Conley Rolls for concern of macular degeneration OU LEE: 12/22/2019 BCVA: 20/60; 20/60   CURRENT MEDICATIONS: No current outpatient medications on file. (Ophthalmic Drugs)   No current facility-administered medications for this visit. (Ophthalmic Drugs)   Current Outpatient Medications (Other)  Medication Sig   Acetaminophen (TYLENOL PO) Take 2 tablets by mouth 2 (two) times daily as needed (pain, headache, fever).   albuterol (PROVENTIL) (2.5 MG/3ML) 0.083% nebulizer solution Take 2.5 mg by nebulization daily.   aspirin 81 MG tablet Take 81 mg by mouth daily.   Budeson-Glycopyrrol-Formoterol (BREZTRI AEROSPHERE) 160-9-4.8 MCG/ACT AERO Inhale 2 puffs into the lungs 2 (two) times daily.   Calcium Carb-Cholecalciferol (CALCIUM + VITAMIN D3 PO) Take 1 tablet by mouth daily.   Multiple Vitamins-Minerals (PRESERVISION AREDS 2 PO) Take 1 capsule by mouth daily.   POTASSIUM PO Take 1 tablet by mouth daily.   predniSONE (DELTASONE) 5 MG tablet Take 8 tablets (40 mg) by mouth for 3 days, 6 tabs (30 mg) for 3 days, 4 tabs (20 mg) for 3 days, 2 tabs (10 mg) for 3 days, 1 tablet (5 mg) for 3 days, 1/2 tablet (2.5 mg) for 3 days then STOP   simvastatin (ZOCOR) 40 MG tablet Take 40 mg by mouth every evening.     triamterene-hydrochlorothiazide (MAXZIDE-25) 37.5-25 MG tablet Take 1 tablet by mouth daily.   Varenicline Tartrate, Starter, (CHANTIX STARTING MONTH PAK) 0.5 MG X 11 & 1 MG X 42 TBPK Use as directed.  STARTER PACK   azithromycin (ZITHROMAX) 500 MG tablet Take 1 tablet (500 mg total) by mouth daily. (Patient not taking: Reported on 08/13/2023)   No current facility-administered medications for this visit. (Other)   REVIEW OF SYSTEMS: ROS   Positive for: Neurological, Musculoskeletal, Eyes, Respiratory Negative for: Constitutional, Gastrointestinal, Skin, Genitourinary, HENT, Endocrine, Cardiovascular, Psychiatric, Allergic/Imm, Heme/Lymph Last edited by Lana Fish, COT on 08/13/2023  8:48 AM.       ALLERGIES No Known Allergies  PAST MEDICAL HISTORY Past Medical History:  Diagnosis Date   Arthritis    Cancer (HCC)    CERVICAL   COPD (chronic obstructive pulmonary disease) (HCC)    Hypercholesteremia    Hypertension    Hypertensive retinopathy    OU   Hypokalemia    Macular degeneration    Non-exu OU   Stroke [facility])    TIA   Thoracic aortic aneurysm without rupture (HCC) 10/29/2016   PER CT CHEST   Vitamin D deficiency    Past Surgical History:  Procedure Laterality Date   ABDOMINAL AORTIC ENDOVASCULAR STENT GRAFT N/A 02/29/2020   Procedure: ABDOMINAL AORTIC ENDOVASCULAR STENT GRAFT;  Surgeon:  Cephus Shelling, MD;  Location: [facility] OR;  Service: Vascular;  Laterality: N/A;   ABDOMINAL HYSTERECTOMY     WITH BSO   CATARACT EXTRACTION Bilateral 2018   Dr. Vonna Kotyk   CHOLECYSTECTOMY     EYE SURGERY Bilateral 2018   Cat Sx - Dr. Vonna Kotyk   FAMILY HISTORY Family History  Problem Relation Age of Onset   Myasthenia gravis Daughter    Epilepsy Son    SOCIAL HISTORY Social History   Tobacco Use   Smoking status: Every Day    Current packs/day: 0.50    Average packs/day: 0.5 packs/day for 60.0 years (30.0 ttl pk-yrs)    Types: Cigarettes   Smokeless tobacco: Former    Tobacco comments:    Quit smoking on 03/05/2023  Vaping Use   Vaping status: Never Used  Substance Use Topics   Alcohol use: No   Drug use: No       OPHTHALMIC EXAM:  Base Eye Exam     Visual Acuity (Snellen - Linear)       Right Left   Dist [place] 20/CF 20/CF   Dist ph [place] 20/200-2 20/400         Tonometry (Tonopen, 8:56 AM)       Right Left   Pressure 13 11         Pupils       Dark Light Shape React APD   Right 3 2 Round Minimal None   Left 3 2 Round Minimal None         Visual Fields       Left Right    Full Full         Extraocular Movement       Right Left    Full, Ortho Full, Ortho         Neuro/Psych     Oriented x3: Yes   Mood/Affect: Normal         Dilation     Both eyes: 1.0% Mydriacyl, 2.5% Phenylephrine @ 8:56 AM           Slit Lamp and Fundus Exam     Slit Lamp Exam       Right Left   Lids/Lashes Dermatochalasis - upper lid Dermatochalasis - upper lid   Conjunctiva/Sclera White and quiet White and quiet   Cornea arcus, well healed temporal cataract wounds,  Mild arcus, well healed temporal cataract wounds, 1+ inferior Punctate epithelial erosions   Anterior Chamber deep and clear deep and clear   Iris Round and dilated Round and moderately dilated to 5.2mm   Lens PC IOL in good position, trace Posterior capsular opacification PC IOL in good position   Anterior Vitreous mild syneresis mild syneresis         Fundus Exam       Right Left   Disc Pink and Sharp, Compact, 360 PPA Pink and Sharp, Compact, temporal PPA   C/D Ratio 0.4 0.3   Macula Flat, Blunted foveal reflex, Drusen, RPE mottling, clumping and atrophy -- central GA slightly increased, No heme or edema Flat, Blunted foveal reflex, Drusen, RPE mottling, clumping and atrophy -- central GA, No heme or edema   Vessels attenuated, mild tortuosity attenuated, mild tortuosity   Periphery Attached, mild reticular degeneration, No heme Attached, reticular  degeneration, No heme           Refraction     Manifest Refraction (Auto)       Sphere Cylinder Axis Dist VA  Right -0.50 +0.75 179 20/CF   Left -0.50 +1.00 178 20/200-2           IMAGING AND PROCEDURES  Imaging and Procedures for @TODAY @  OCT, Retina - OU - Both Eyes       Right Eye Quality was good. Central Foveal Thickness: 351. Progression has worsened. Findings include no IRF, no SRF, abnormal foveal contour, retinal drusen , epiretinal membrane, macular pucker, outer retinal atrophy (Interval progression of central GA seen best on en face imaging, mild ERM and pucker -- stable).   Left Eye Quality was good. Central Foveal Thickness: 304. Progression has worsened. Findings include no IRF, no SRF, abnormal foveal contour, retinal drusen , epiretinal membrane, outer retinal atrophy (Mild progression of ORA/GA subfovea ).   Notes *Images captured and stored on drive  Diagnosis / Impression:  Non exudative ARMD OU OD: Interval progression of central GA seen best on en face imaging, mild ERM and pucker -- stable OS: Mild progression of ORA/GA subfovea    Clinical management:  See below  Abbreviations: NFP - Normal foveal profile. CME - cystoid macular edema. PED - pigment epithelial detachment. IRF - intraretinal fluid. SRF - subretinal fluid. EZ - ellipsoid zone. ERM - epiretinal membrane. ORA - outer retinal atrophy. ORT - outer retinal tubulation. SRHM - subretinal hyper-reflective material             ASSESSMENT/PLAN:    ICD-10-CM   1. Advanced atrophic nonexudative age-related macular degeneration of both eyes with subfoveal involvement  H35.3134 OCT, Retina - OU - Both Eyes    2. Essential hypertension  I10     3. Hypertensive retinopathy of both eyes  H35.033     4. Pseudophakia of both eyes  Z96.1       ** Patient left before being seen, didn't want to wait. **   1. Age related macular degeneration, non-exudative, both eyes  - advanced  atrophic stage w/ +ORA and GA OU  - focal linear IRH inferior macula OS noted on exam, 01.28.22 -- stably resolved, no heme  - no fluid or edema on OCT or exam  - BCVA OD: 20/100, OS: CF@3 ' -- both decreased from 20/60  - OCT shows OD: Interval progression of central GA seen best on en face imaging, mild ERM and pucker -- stable; OS: Mild progression of ORA/GA subfovea   - pt reports continued smoking of 1/2 ppd -- discussed smoking risks related to ARMD and advised cessation of smoking  - pt reports that she has stopped driving  - Recommend amsler grid monitoring  - [facility]  - f/u 6-9 months, sooner prn -- DFE, OCT  2,3. Hypertensive retinopathy OU  - discussed importance of tight BP control  - monitor  4. Pseudophakia OU  - s/p CE/IOL OU (Dr. Vonna Kotyk, 2018)  - IOLs in good position, doing well  - monitor  Ophthalmic Meds Ordered this visit:  No orders of the defined types were placed in this encounter.  This document serves as a record of services personally performed by Karie Chimera, MD, PhD. It was created on their behalf by Berlin Hun COT, an ophthalmic technician. The creation of this record is the provider's dictation and/or activities during the visit.    Electronically signed by: Berlin Hun COT 01.22.25 10:04 AM    Abbreviations: M myopia (nearsighted); A astigmatism; H hyperopia (farsighted); P presbyopia; Mrx spectacle prescription;  CTL contact lenses; OD  right eye; OS left eye; OU both eyes  XT exotropia; ET esotropia; PEK punctate epithelial keratitis; PEE punctate epithelial erosions; DES dry eye syndrome; MGD meibomian gland dysfunction; ATs artificial tears; PFAT's preservative free artificial tears; NSC nuclear sclerotic cataract; PSC posterior subcapsular cataract; ERM epi-retinal membrane; PVD posterior vitreous detachment; RD retinal detachment; DM diabetes mellitus; DR diabetic retinopathy; NPDR  non-proliferative diabetic retinopathy; PDR proliferative diabetic retinopathy; CSME clinically significant macular edema; DME diabetic macular edema; dbh dot blot hemorrhages; CWS cotton wool spot; POAG primary open angle glaucoma; C/D cup-to-disc ratio; HVF humphrey visual field; GVF goldmann visual field; OCT optical coherence tomography; IOP intraocular pressure; BRVO Branch retinal vein occlusion; CRVO central retinal vein occlusion; CRAO central retinal artery occlusion; BRAO branch retinal artery occlusion; RT retinal tear; SB scleral buckle; PPV pars plana vitrectomy; VH Vitreous hemorrhage; PRP panretinal laser photocoagulation; IVK intravitreal kenalog; VMT vitreomacular traction; MH Macular hole;  NVD neovascularization of the disc; NVE neovascularization elsewhere; AREDS age related eye disease study; ARMD age related macular degeneration; POAG primary open angle glaucoma; EBMD epithelial/anterior basement membrane dystrophy; ACIOL anterior chamber intraocular lens; IOL intraocular lens; PCIOL posterior chamber intraocular lens; Phaco/IOL phacoemulsification with intraocular lens placement; PRK photorefractive keratectomy; LASIK laser assisted in situ keratomileusis; HTN hypertension; DM diabetes mellitus; COPD chronic obstructive pulmonary disease.

## 2023-08-13 ENCOUNTER — Encounter (INDEPENDENT_AMBULATORY_CARE_PROVIDER_SITE_OTHER): Payer: Self-pay | Admitting: Ophthalmology

## 2023-08-13 ENCOUNTER — Encounter (INDEPENDENT_AMBULATORY_CARE_PROVIDER_SITE_OTHER): Payer: Medicare HMO | Admitting: Ophthalmology

## 2023-08-13 DIAGNOSIS — I10 Essential (primary) hypertension: Secondary | ICD-10-CM

## 2023-08-13 DIAGNOSIS — Z961 Presence of intraocular lens: Secondary | ICD-10-CM

## 2023-08-13 DIAGNOSIS — H35033 Hypertensive retinopathy, bilateral: Secondary | ICD-10-CM

## 2023-08-13 DIAGNOSIS — H353134 Nonexudative age-related macular degeneration, bilateral, advanced atrophic with subfoveal involvement: Secondary | ICD-10-CM

## 2023-08-13 NOTE — Progress Notes (Signed)
This encounter was created in error - please disregard.

## 2023-08-19 ENCOUNTER — Encounter (HOSPITAL_BASED_OUTPATIENT_CLINIC_OR_DEPARTMENT_OTHER): Payer: Self-pay

## 2023-08-19 ENCOUNTER — Ambulatory Visit
Admission: RE | Admit: 2023-08-19 | Discharge: 2023-08-19 | Disposition: A | Discharge status: Home / Self Care | Payer: Medicare HMO | Source: Ambulatory Visit | Attending: Pulmonary Disease | Admitting: Pulmonary Disease

## 2023-08-19 ENCOUNTER — Encounter (HOSPITAL_BASED_OUTPATIENT_CLINIC_OR_DEPARTMENT_OTHER): Payer: Medicare HMO

## 2023-08-19 DIAGNOSIS — D3502 Benign neoplasm of left adrenal gland: Secondary | ICD-10-CM | POA: Diagnosis not present

## 2023-08-19 DIAGNOSIS — R918 Other nonspecific abnormal finding of lung field: Secondary | ICD-10-CM | POA: Diagnosis not present

## 2023-08-19 DIAGNOSIS — J42 Unspecified chronic bronchitis: Secondary | ICD-10-CM

## 2023-08-19 DIAGNOSIS — R0609 Other forms of dyspnea: Secondary | ICD-10-CM | POA: Diagnosis not present

## 2023-08-19 DIAGNOSIS — J9601 Acute respiratory failure with hypoxia: Secondary | ICD-10-CM

## 2023-08-19 DIAGNOSIS — I700 Atherosclerosis of aorta: Secondary | ICD-10-CM | POA: Diagnosis not present

## 2023-08-20 ENCOUNTER — Other Ambulatory Visit: Payer: Medicare HMO

## 2023-08-25 DIAGNOSIS — L84 Corns and callosities: Secondary | ICD-10-CM | POA: Diagnosis not present

## 2023-08-25 DIAGNOSIS — R0981 Nasal congestion: Secondary | ICD-10-CM | POA: Diagnosis not present

## 2023-08-25 DIAGNOSIS — H9202 Otalgia, left ear: Secondary | ICD-10-CM | POA: Diagnosis not present

## 2023-08-25 DIAGNOSIS — R051 Acute cough: Secondary | ICD-10-CM | POA: Diagnosis not present

## 2023-09-07 DIAGNOSIS — R509 Fever, unspecified: Secondary | ICD-10-CM | POA: Diagnosis not present

## 2023-09-07 DIAGNOSIS — R0602 Shortness of breath: Secondary | ICD-10-CM | POA: Diagnosis not present

## 2023-09-07 DIAGNOSIS — R059 Cough, unspecified: Secondary | ICD-10-CM | POA: Diagnosis not present

## 2023-09-07 DIAGNOSIS — R0981 Nasal congestion: Secondary | ICD-10-CM | POA: Diagnosis not present

## 2023-09-07 DIAGNOSIS — R062 Wheezing: Secondary | ICD-10-CM | POA: Diagnosis not present

## 2023-09-23 ENCOUNTER — Ambulatory Visit (INDEPENDENT_AMBULATORY_CARE_PROVIDER_SITE_OTHER): Payer: Medicare HMO | Admitting: Podiatry

## 2023-09-23 ENCOUNTER — Ambulatory Visit (INDEPENDENT_AMBULATORY_CARE_PROVIDER_SITE_OTHER)

## 2023-09-23 ENCOUNTER — Encounter: Payer: Self-pay | Admitting: Podiatry

## 2023-09-23 DIAGNOSIS — M2041 Other hammer toe(s) (acquired), right foot: Secondary | ICD-10-CM

## 2023-09-23 DIAGNOSIS — M79671 Pain in right foot: Secondary | ICD-10-CM | POA: Diagnosis not present

## 2023-09-23 DIAGNOSIS — L84 Corns and callosities: Secondary | ICD-10-CM

## 2023-09-23 NOTE — Progress Notes (Signed)
 Chief Complaint  Patient presents with   Toe Pain    Patient states she has a bump on top of her 2nd toe right foot and it has been there for about 3 weeks, it is painful patient states.    HPI: 83 y.o. female presents today with painful mass to right 2nd toe.  She has associated second toe deformity.  She reports painful callus at the site which has recurred over the past 3 weeks. She feels it is painful with the underlying bone.  Does endorse smoking half pack of cigarettes a day.  Past Medical History:  Diagnosis Date   Arthritis    Cancer (HCC)    CERVICAL   COPD (chronic obstructive pulmonary disease) (HCC)    Hypercholesteremia    Hypertension    Hypertensive retinopathy    OU   Hypokalemia    Macular degeneration    Non-exu OU   Stroke [facility])    TIA   Thoracic aortic aneurysm without rupture (HCC) 10/29/2016   PER CT CHEST   Vitamin D deficiency     Past Surgical History:  Procedure Laterality Date   ABDOMINAL AORTIC ENDOVASCULAR STENT GRAFT N/A 02/29/2020   Procedure: ABDOMINAL AORTIC ENDOVASCULAR STENT GRAFT;  Surgeon: Cephus Shelling, MD;  Location: [facility] OR;  Service: Vascular;  Laterality: N/A;   ABDOMINAL HYSTERECTOMY     WITH BSO   CATARACT EXTRACTION Bilateral 2018   Dr. Vonna Kotyk   CHOLECYSTECTOMY     EYE SURGERY Bilateral 2018   Cat Sx - Dr. Vonna Kotyk    No Known Allergies  ROS    Physical Exam: There were no vitals filed for this visit.  General: The patient is alert and oriented x3 in no acute distress.  Dermatology: Skin is warm, dry and supple bilateral lower extremities. Interspaces are clear of maceration and debris.  Hyperkeratotic preulcerative callus present right second toe medial PIPJ.  No underlying ulceration.  Vascular: Palpable pedal pulses bilaterally. Capillary refill within normal limits.  No diffuse edema.  Pedal skin atrophic.  Telangiectasias appreciated.  Neurological: Light touch sensation grossly intact bilateral feet.    Musculoskeletal Exam: Second toe hammertoe deformity with chronic dislocation of second PIPJ right foot.  Mild bunion deformity noted.  Contracture of the other lesser digits noted as well.  Radiographic Exam: Right foot 09/23/2023 Chronic dislocation of right second PIPJ with the intermediate phalanx plantarly dislocated.  There is mild bunion deformity present. 3rd MPJ arthritis noted with abnormality of third metatarsal head, likely history of AVN.  Assessment/Plan of Care: 1. Hammertoe of right foot   2. Pre-ulcerative calluses      No orders of the defined types were placed in this encounter.  None  Discussed clinical findings with patient today.  Preulcerative callus right second toe was sharply debrided using #15 blade without incident today.  Toe spacer dispensed.  Did discuss self-care to limit the recurrence of the lesion.  Discussed the underlying hammertoe deformity, and likely roll causing recurrence to of the painful lesion. Did discuss conservative vs surgical management. We discussed surgical treatment describing 2nd toe hammertoe correction. Concern for risk of developing ulceration with conservative treatment alone due to the underlying deformity. She is interested in surgical correction. She does report being partially blind, we did discuss postoperative need for weightbearing precautions, and patient expressed good understanding. While she does have some mild associated bunion deformity, limiting postoperative recovery is a priority to the patient and plan would be  to focus on correcting the 2nd toe deformity at this time.  Patient will require medical clearance from PCP due to history of COPD and abdominal aortic aneurysm.  Also discussed need for smoking cessation with patient to limit complications.  Informed surgical risk consent was reviewed and read aloud to the patient.  I reviewed the films.  I have discussed my findings with the patient in great detail.  I have  discussed all risks including but not limited to infection, stiffness, scarring, limp, disability, deformity, damage to blood vessels and nerves, numbness, poor healing, arthritis, chronic pain, amputation, death.  All benefits and realistic expectations discussed in great detail.  I have made no promises as to the outcome.  I have provided realistic expectations.  I have offered the patient a 2nd opinion, which they have declined and assured me they preferred to proceed despite the risks   Surgery scheduling to contact patient.  Cornella Emmer L. Marchia Bond, AACFAS [facility]     [address] [place], [place] [address]                Office [phone]  Fax [phone]

## 2023-09-26 ENCOUNTER — Encounter: Payer: Self-pay | Admitting: Podiatry

## 2023-12-03 ENCOUNTER — Ambulatory Visit (INDEPENDENT_AMBULATORY_CARE_PROVIDER_SITE_OTHER): Admitting: Podiatry

## 2023-12-03 ENCOUNTER — Encounter: Payer: Self-pay | Admitting: Podiatry

## 2023-12-03 DIAGNOSIS — L84 Corns and callosities: Secondary | ICD-10-CM | POA: Diagnosis not present

## 2023-12-03 DIAGNOSIS — M2041 Other hammer toe(s) (acquired), right foot: Secondary | ICD-10-CM | POA: Diagnosis not present

## 2023-12-03 NOTE — Progress Notes (Signed)
 Chief Complaint  Patient presents with   Foot Pain    RM#1 Follow up on right foot pain patient states not any better worsening.    HPI: 83 y.o. female presents today to follow-up for soft tissue callus to right 2nd toe.  She has associated second toe deformity.  Previously this has been painful for the patient.  She denies pain today.  We previously did discuss surgical intervention for this.  She has not seen PCP regarding clearance for her COPD or other medical issues.  She does state that she is less interested in surgery at this point as it has not been painful although she has had recurrence of the lesion.  Does endorse smoking half pack of cigarettes a day.  Past Medical History:  Diagnosis Date   Arthritis    Cancer (HCC)    CERVICAL   COPD (chronic obstructive pulmonary disease) (HCC)    Hypercholesteremia    Hypertension    Hypertensive retinopathy    OU   Hypokalemia    Macular degeneration    Non-exu OU   Stroke [facility])    TIA   Thoracic aortic aneurysm without rupture (HCC) 10/29/2016   PER CT CHEST   Vitamin D  deficiency     Past Surgical History:  Procedure Laterality Date   ABDOMINAL AORTIC ENDOVASCULAR STENT GRAFT N/A 02/29/2020   Procedure: ABDOMINAL AORTIC ENDOVASCULAR STENT GRAFT;  Surgeon: Young Hensen, MD;  Location: [facility] OR;  Service: Vascular;  Laterality: N/A;   ABDOMINAL HYSTERECTOMY     WITH BSO   CATARACT EXTRACTION Bilateral 2018   Dr. Lenna Quinton   CHOLECYSTECTOMY     EYE SURGERY Bilateral 2018   Cat Sx - Dr. Lenna Quinton    No Known Allergies  ROS    Physical Exam: There were no vitals filed for this visit.  General: The patient is alert and oriented x3 in no acute distress.  Dermatology: Skin is warm, dry and supple bilateral lower extremities. Interspaces are clear of maceration and debris.  Hyperkeratotic preulcerative callus present right second toe medial PIPJ.  No underlying ulceration. Of note, pedal skin is thin and  atrophic.  Vascular: Palpable pedal pulses bilaterally. Capillary refill within normal limits.  No diffuse edema.  Pedal skin atrophic.  Telangiectasias appreciated.  Neurological: Light touch sensation grossly intact bilateral feet.   Musculoskeletal Exam: Second toe hammertoe deformity with chronic dislocation of second PIPJ right foot.  Mild bunion deformity noted.  Contracture of the other lesser digits noted as well.  Radiographic Exam: Right foot 09/23/2023 Chronic dislocation of right second PIPJ with the intermediate phalanx plantarly dislocated.  There is mild bunion deformity present. 3rd MPJ arthritis noted with abnormality of third metatarsal head, likely history of AVN.  Assessment/Plan of Care: 1. Hammertoe of right foot   2. Pre-ulcerative calluses       No orders of the defined types were placed in this encounter.  None  Discussed clinical findings with patient today.  Palliative sharp debridement of preulcerative callus right second toe dorsal medial DIPJ was performed today using #15 blade as a courtesy.  Gel toe cap was dispensed today.  Did discuss self-care to limit recurrence of the lesion.  She does have underlying osseous deformity with chronic dislocation of DIPJ contributing to this.  Does have associated hammertoe contractures and bunion deformity.  Overall she is denying significant pain today.  I think given this and due to her medical problems we will hold  off on surgery at this point.   Lahari Suttles L. Lunda Salines, AACFAS [facility]     [address] [place], [place] [address]                Office [phone]  Fax [phone]

## 2023-12-05 ENCOUNTER — Encounter: Payer: Self-pay | Admitting: Podiatry

## 2023-12-20 ENCOUNTER — Telehealth: Payer: Self-pay

## 2023-12-20 DIAGNOSIS — I10 Essential (primary) hypertension: Secondary | ICD-10-CM | POA: Diagnosis not present

## 2023-12-20 DIAGNOSIS — Z8673 Personal history of transient ischemic attack (TIA), and cerebral infarction without residual deficits: Secondary | ICD-10-CM | POA: Diagnosis not present

## 2023-12-20 DIAGNOSIS — F17200 Nicotine dependence, unspecified, uncomplicated: Secondary | ICD-10-CM | POA: Diagnosis not present

## 2023-12-20 DIAGNOSIS — I7143 Infrarenal abdominal aortic aneurysm, without rupture: Secondary | ICD-10-CM | POA: Diagnosis not present

## 2023-12-20 DIAGNOSIS — Z01818 Encounter for other preprocedural examination: Secondary | ICD-10-CM | POA: Diagnosis not present

## 2023-12-20 DIAGNOSIS — J449 Chronic obstructive pulmonary disease, unspecified: Secondary | ICD-10-CM | POA: Diagnosis not present

## 2023-12-20 DIAGNOSIS — E7800 Pure hypercholesterolemia, unspecified: Secondary | ICD-10-CM | POA: Diagnosis not present

## 2023-12-20 NOTE — Telephone Encounter (Signed)
 Patient called and left a message - she is waiting for surgery clearance from Dr. Meriam Stamp to send to her PCP? Do any of you guys have any forms for this patient?

## 2023-12-21 ENCOUNTER — Telehealth: Payer: Self-pay | Admitting: Podiatry

## 2023-12-21 NOTE — Telephone Encounter (Signed)
 Pt called stating she had a physical yesterday with her pcp and they are saying you have a form they need in order for pt to have physical. Pt states she is to be having surgery and is needing this form filled out.  Pcp is correct in chart. Please advise is pt to have surgery? I do not have a consent form and I explained that to pt as well. I do have H & P forms if needed.

## 2023-12-24 NOTE — Telephone Encounter (Signed)
 Pt called back and states she has had her physical and when is she to be scheduled for surgery.  Upon checking your note it says due to other health conditions and not having significant pain hold off on surgery.  She said she is confused and wanted you to call her to figure out what the hell is going on.

## 2023-12-27 ENCOUNTER — Telehealth: Payer: Self-pay

## 2023-12-27 NOTE — Telephone Encounter (Signed)
 Patient left a message on 6/6/ and 6/9 stating that she would like to know what is going on with her surgery. I returned call to patient and she states that she has gotten the surgical clearance from her PCP and it was supposed to have been sent here. She states that the foot is very painful, swollen and the lesion is back. She is now interested in having surgery. Patient would ike a call back.

## 2023-12-27 NOTE — Telephone Encounter (Signed)
 Called pt and scheduled her to see Dr Marvis Sluder to sign the surgical consent form.

## 2023-12-27 NOTE — Telephone Encounter (Signed)
 error

## 2023-12-31 ENCOUNTER — Ambulatory Visit: Admitting: Podiatry

## 2024-01-03 DIAGNOSIS — Z01818 Encounter for other preprocedural examination: Secondary | ICD-10-CM | POA: Diagnosis not present

## 2024-01-28 DIAGNOSIS — I10 Essential (primary) hypertension: Secondary | ICD-10-CM | POA: Diagnosis not present

## 2024-01-28 DIAGNOSIS — E559 Vitamin D deficiency, unspecified: Secondary | ICD-10-CM | POA: Diagnosis not present

## 2024-01-28 DIAGNOSIS — Z0000 Encounter for general adult medical examination without abnormal findings: Secondary | ICD-10-CM | POA: Diagnosis not present

## 2024-01-28 DIAGNOSIS — E7800 Pure hypercholesterolemia, unspecified: Secondary | ICD-10-CM | POA: Diagnosis not present

## 2024-02-04 DIAGNOSIS — Z0000 Encounter for general adult medical examination without abnormal findings: Secondary | ICD-10-CM | POA: Diagnosis not present

## 2024-02-04 DIAGNOSIS — J449 Chronic obstructive pulmonary disease, unspecified: Secondary | ICD-10-CM | POA: Diagnosis not present

## 2024-02-04 DIAGNOSIS — E7800 Pure hypercholesterolemia, unspecified: Secondary | ICD-10-CM | POA: Diagnosis not present

## 2024-02-04 DIAGNOSIS — E559 Vitamin D deficiency, unspecified: Secondary | ICD-10-CM | POA: Diagnosis not present

## 2024-09-13 ENCOUNTER — Ambulatory Visit: Admitting: Podiatry
